# Patient Record
Sex: Female | Born: 1960 | Race: White | Hispanic: No | Smoking: Never smoker
Health system: Southern US, Community
[De-identification: ages and names within clinical notes are randomized; demographics above are authoritative.]

## PROBLEM LIST (undated history)

## (undated) DIAGNOSIS — F419 Anxiety disorder, unspecified: Secondary | ICD-10-CM

## (undated) DIAGNOSIS — Z923 Personal history of irradiation: Secondary | ICD-10-CM

## (undated) DIAGNOSIS — E785 Hyperlipidemia, unspecified: Secondary | ICD-10-CM

## (undated) DIAGNOSIS — C50919 Malignant neoplasm of unspecified site of unspecified female breast: Secondary | ICD-10-CM

## (undated) DIAGNOSIS — C50412 Malignant neoplasm of upper-outer quadrant of left female breast: Secondary | ICD-10-CM

## (undated) DIAGNOSIS — K219 Gastro-esophageal reflux disease without esophagitis: Secondary | ICD-10-CM

## (undated) DIAGNOSIS — I10 Essential (primary) hypertension: Secondary | ICD-10-CM

## (undated) DIAGNOSIS — E119 Type 2 diabetes mellitus without complications: Secondary | ICD-10-CM

## (undated) HISTORY — DX: Hyperlipidemia, unspecified: E78.5

## (undated) HISTORY — DX: Gastro-esophageal reflux disease without esophagitis: K21.9

## (undated) HISTORY — PX: MOUTH SURGERY: SHX715

## (undated) HISTORY — DX: Malignant neoplasm of unspecified site of unspecified female breast: C50.919

## (undated) HISTORY — DX: Essential (primary) hypertension: I10

## (undated) HISTORY — DX: Type 2 diabetes mellitus without complications: E11.9

## (undated) HISTORY — DX: Malignant neoplasm of upper-outer quadrant of left female breast: C50.412

---

## 2008-07-05 ENCOUNTER — Ambulatory Visit: Payer: Self-pay | Admitting: Family Medicine

## 2009-10-03 ENCOUNTER — Ambulatory Visit: Payer: Self-pay | Admitting: Family Medicine

## 2009-10-30 ENCOUNTER — Emergency Department: Payer: Self-pay | Admitting: Emergency Medicine

## 2010-12-12 ENCOUNTER — Ambulatory Visit: Payer: Self-pay | Admitting: Unknown Physician Specialty

## 2012-07-09 ENCOUNTER — Ambulatory Visit: Payer: Self-pay | Admitting: Physician Assistant

## 2014-08-15 ENCOUNTER — Ambulatory Visit: Payer: Self-pay | Admitting: Physician Assistant

## 2014-11-02 DIAGNOSIS — E669 Obesity, unspecified: Secondary | ICD-10-CM | POA: Insufficient documentation

## 2016-02-20 ENCOUNTER — Other Ambulatory Visit: Payer: Self-pay | Admitting: Physician Assistant

## 2016-02-20 DIAGNOSIS — Z1231 Encounter for screening mammogram for malignant neoplasm of breast: Secondary | ICD-10-CM

## 2016-03-07 ENCOUNTER — Ambulatory Visit: Payer: Self-pay | Attending: Physician Assistant

## 2016-03-28 ENCOUNTER — Ambulatory Visit
Admission: RE | Admit: 2016-03-28 | Discharge: 2016-03-28 | Disposition: A | Discharge status: Home / Self Care | Payer: 59 | Source: Ambulatory Visit | Attending: Physician Assistant | Admitting: Physician Assistant

## 2016-03-28 DIAGNOSIS — Z1231 Encounter for screening mammogram for malignant neoplasm of breast: Secondary | ICD-10-CM | POA: Insufficient documentation

## 2016-11-12 DIAGNOSIS — I10 Essential (primary) hypertension: Secondary | ICD-10-CM | POA: Diagnosis not present

## 2016-11-12 DIAGNOSIS — E782 Mixed hyperlipidemia: Secondary | ICD-10-CM | POA: Diagnosis not present

## 2017-02-10 DIAGNOSIS — I10 Essential (primary) hypertension: Secondary | ICD-10-CM | POA: Diagnosis not present

## 2017-02-10 DIAGNOSIS — R739 Hyperglycemia, unspecified: Secondary | ICD-10-CM | POA: Diagnosis not present

## 2017-02-10 DIAGNOSIS — E782 Mixed hyperlipidemia: Secondary | ICD-10-CM | POA: Diagnosis not present

## 2017-02-12 DIAGNOSIS — E782 Mixed hyperlipidemia: Secondary | ICD-10-CM | POA: Diagnosis not present

## 2017-02-12 DIAGNOSIS — I10 Essential (primary) hypertension: Secondary | ICD-10-CM | POA: Diagnosis not present

## 2017-02-17 DIAGNOSIS — R7303 Prediabetes: Secondary | ICD-10-CM | POA: Insufficient documentation

## 2017-05-07 ENCOUNTER — Other Ambulatory Visit: Payer: Self-pay | Admitting: Physician Assistant

## 2017-05-07 DIAGNOSIS — E782 Mixed hyperlipidemia: Secondary | ICD-10-CM | POA: Diagnosis not present

## 2017-05-07 DIAGNOSIS — Z0000 Encounter for general adult medical examination without abnormal findings: Secondary | ICD-10-CM | POA: Diagnosis not present

## 2017-05-07 DIAGNOSIS — I10 Essential (primary) hypertension: Secondary | ICD-10-CM | POA: Diagnosis not present

## 2017-05-07 DIAGNOSIS — Z1231 Encounter for screening mammogram for malignant neoplasm of breast: Secondary | ICD-10-CM

## 2017-05-07 DIAGNOSIS — Z124 Encounter for screening for malignant neoplasm of cervix: Secondary | ICD-10-CM | POA: Diagnosis not present

## 2017-05-22 ENCOUNTER — Ambulatory Visit
Admission: RE | Admit: 2017-05-22 | Discharge: 2017-05-22 | Disposition: A | Discharge status: Home / Self Care | Payer: 59 | Source: Ambulatory Visit | Attending: Physician Assistant | Admitting: Physician Assistant

## 2017-05-22 DIAGNOSIS — Z1231 Encounter for screening mammogram for malignant neoplasm of breast: Secondary | ICD-10-CM

## 2017-08-11 DIAGNOSIS — Z23 Encounter for immunization: Secondary | ICD-10-CM | POA: Diagnosis not present

## 2017-09-14 DIAGNOSIS — J209 Acute bronchitis, unspecified: Secondary | ICD-10-CM | POA: Diagnosis not present

## 2017-11-04 DIAGNOSIS — I10 Essential (primary) hypertension: Secondary | ICD-10-CM | POA: Diagnosis not present

## 2017-11-04 DIAGNOSIS — E782 Mixed hyperlipidemia: Secondary | ICD-10-CM | POA: Diagnosis not present

## 2017-11-04 DIAGNOSIS — R7303 Prediabetes: Secondary | ICD-10-CM | POA: Diagnosis not present

## 2017-11-10 DIAGNOSIS — E782 Mixed hyperlipidemia: Secondary | ICD-10-CM | POA: Diagnosis not present

## 2017-11-10 DIAGNOSIS — R7303 Prediabetes: Secondary | ICD-10-CM | POA: Diagnosis not present

## 2017-11-10 DIAGNOSIS — I10 Essential (primary) hypertension: Secondary | ICD-10-CM | POA: Diagnosis not present

## 2017-11-29 DIAGNOSIS — C50412 Malignant neoplasm of upper-outer quadrant of left female breast: Secondary | ICD-10-CM

## 2017-11-29 HISTORY — DX: Malignant neoplasm of upper-outer quadrant of left female breast: C50.412

## 2017-12-29 DIAGNOSIS — R7303 Prediabetes: Secondary | ICD-10-CM | POA: Diagnosis not present

## 2017-12-29 DIAGNOSIS — I10 Essential (primary) hypertension: Secondary | ICD-10-CM | POA: Diagnosis not present

## 2018-02-24 DIAGNOSIS — E782 Mixed hyperlipidemia: Secondary | ICD-10-CM | POA: Diagnosis not present

## 2018-02-24 DIAGNOSIS — R7303 Prediabetes: Secondary | ICD-10-CM | POA: Diagnosis not present

## 2018-02-24 DIAGNOSIS — I10 Essential (primary) hypertension: Secondary | ICD-10-CM | POA: Diagnosis not present

## 2018-03-03 DIAGNOSIS — E782 Mixed hyperlipidemia: Secondary | ICD-10-CM | POA: Diagnosis not present

## 2018-03-03 DIAGNOSIS — R7303 Prediabetes: Secondary | ICD-10-CM | POA: Diagnosis not present

## 2018-03-03 DIAGNOSIS — I10 Essential (primary) hypertension: Secondary | ICD-10-CM | POA: Diagnosis not present

## 2018-06-03 DIAGNOSIS — I10 Essential (primary) hypertension: Secondary | ICD-10-CM | POA: Diagnosis not present

## 2018-06-03 DIAGNOSIS — E782 Mixed hyperlipidemia: Secondary | ICD-10-CM | POA: Diagnosis not present

## 2018-06-03 DIAGNOSIS — R7303 Prediabetes: Secondary | ICD-10-CM | POA: Diagnosis not present

## 2018-06-08 DIAGNOSIS — Z1239 Encounter for other screening for malignant neoplasm of breast: Secondary | ICD-10-CM | POA: Diagnosis not present

## 2018-06-08 DIAGNOSIS — I10 Essential (primary) hypertension: Secondary | ICD-10-CM | POA: Diagnosis not present

## 2018-06-08 DIAGNOSIS — Z0000 Encounter for general adult medical examination without abnormal findings: Secondary | ICD-10-CM | POA: Diagnosis not present

## 2018-07-23 ENCOUNTER — Other Ambulatory Visit: Payer: Self-pay | Admitting: Physician Assistant

## 2018-07-23 DIAGNOSIS — Z1231 Encounter for screening mammogram for malignant neoplasm of breast: Secondary | ICD-10-CM

## 2018-07-28 DIAGNOSIS — C50919 Malignant neoplasm of unspecified site of unspecified female breast: Secondary | ICD-10-CM

## 2018-07-28 HISTORY — DX: Malignant neoplasm of unspecified site of unspecified female breast: C50.919

## 2018-07-29 ENCOUNTER — Ambulatory Visit
Admission: RE | Admit: 2018-07-29 | Discharge: 2018-07-29 | Disposition: A | Discharge status: Home / Self Care | Payer: 59 | Source: Ambulatory Visit | Attending: Physician Assistant | Admitting: Physician Assistant

## 2018-07-29 DIAGNOSIS — Z1231 Encounter for screening mammogram for malignant neoplasm of breast: Secondary | ICD-10-CM | POA: Insufficient documentation

## 2018-08-03 ENCOUNTER — Other Ambulatory Visit: Payer: Self-pay | Admitting: Physician Assistant

## 2018-08-03 DIAGNOSIS — J40 Bronchitis, not specified as acute or chronic: Secondary | ICD-10-CM | POA: Diagnosis not present

## 2018-08-03 DIAGNOSIS — R928 Other abnormal and inconclusive findings on diagnostic imaging of breast: Secondary | ICD-10-CM

## 2018-08-07 ENCOUNTER — Ambulatory Visit
Admission: RE | Admit: 2018-08-07 | Discharge: 2018-08-07 | Disposition: A | Discharge status: Home / Self Care | Payer: 59 | Source: Ambulatory Visit | Attending: Physician Assistant | Admitting: Physician Assistant

## 2018-08-07 DIAGNOSIS — R928 Other abnormal and inconclusive findings on diagnostic imaging of breast: Secondary | ICD-10-CM | POA: Diagnosis present

## 2018-08-07 DIAGNOSIS — N6489 Other specified disorders of breast: Secondary | ICD-10-CM | POA: Diagnosis not present

## 2018-08-11 ENCOUNTER — Other Ambulatory Visit: Payer: Self-pay | Admitting: Physician Assistant

## 2018-08-11 DIAGNOSIS — R928 Other abnormal and inconclusive findings on diagnostic imaging of breast: Secondary | ICD-10-CM

## 2018-08-17 ENCOUNTER — Ambulatory Visit
Admission: RE | Admit: 2018-08-17 | Discharge: 2018-08-17 | Disposition: A | Discharge status: Home / Self Care | Payer: 59 | Source: Ambulatory Visit | Attending: Physician Assistant | Admitting: Physician Assistant

## 2018-08-17 DIAGNOSIS — R928 Other abnormal and inconclusive findings on diagnostic imaging of breast: Secondary | ICD-10-CM

## 2018-08-17 DIAGNOSIS — C50412 Malignant neoplasm of upper-outer quadrant of left female breast: Secondary | ICD-10-CM | POA: Diagnosis not present

## 2018-08-17 HISTORY — PX: BREAST BIOPSY: SHX20

## 2018-08-19 DIAGNOSIS — C50412 Malignant neoplasm of upper-outer quadrant of left female breast: Secondary | ICD-10-CM | POA: Diagnosis not present

## 2018-08-20 ENCOUNTER — Other Ambulatory Visit: Payer: Self-pay | Admitting: Physician Assistant

## 2018-08-20 ENCOUNTER — Other Ambulatory Visit: Payer: Self-pay | Admitting: Anatomic Pathology & Clinical Pathology

## 2018-08-20 ENCOUNTER — Other Ambulatory Visit: Payer: Self-pay

## 2018-08-20 ENCOUNTER — Other Ambulatory Visit (HOSPITAL_COMMUNITY): Payer: Self-pay | Admitting: Physician Assistant

## 2018-08-20 DIAGNOSIS — C50412 Malignant neoplasm of upper-outer quadrant of left female breast: Secondary | ICD-10-CM

## 2018-08-21 LAB — SURGICAL PATHOLOGY

## 2018-08-25 NOTE — Progress Notes (Signed)
  Oncology Nurse Navigator Documentation  Navigator Location: [facility] (08/24/18 1600)   )Navigator Encounter Type: Other([facility] patient visiting family member) (08/24/18 1600)   Abnormal Finding Date: 08/07/18 (08/24/18 1600) Confirmed Diagnosis Date: 08/17/18 (08/24/18 1600)               Patient Visit Type: Initial (08/24/18 1600) Treatment Phase: Pre-Tx/Tx Discussion (08/24/18 1600) Barriers/Navigation Needs: Education;Coordination of Care (08/24/18 1600) Education: Newly Diagnosed Cancer Education;Coping with Diagnosis/ Prognosis;Understanding Cancer/ Treatment Options (08/24/18 1600) Interventions: Education;Psycho-social support (08/24/18 1600)                      Time Spent with Patient: 45 (08/24/18 1600)     Met with patient , and her niece here at hospital.  Introduced to [employer].  Given Breast Cancer Treatment Handbook/folder with hospital services. Confirmed appointment with Dr. Bary Castilla.

## 2018-08-26 ENCOUNTER — Encounter (HOSPITAL_COMMUNITY): Payer: Self-pay

## 2018-08-26 ENCOUNTER — Other Ambulatory Visit: Payer: Self-pay | Admitting: Physician Assistant

## 2018-08-26 ENCOUNTER — Ambulatory Visit (HOSPITAL_COMMUNITY)
Admission: RE | Admit: 2018-08-26 | Discharge: 2018-08-26 | Disposition: A | Discharge status: Home / Self Care | Payer: 59 | Source: Ambulatory Visit | Attending: Physician Assistant | Admitting: Physician Assistant

## 2018-08-26 DIAGNOSIS — C50412 Malignant neoplasm of upper-outer quadrant of left female breast: Secondary | ICD-10-CM

## 2018-08-26 MED ORDER — GADOBUTROL 1 MMOL/ML IV SOLN: 10.0000 mL | Freq: Once | INTRAVENOUS | Status: DC | PRN

## 2018-08-26 NOTE — Progress Notes (Signed)
Attempted patient for MRI Breast wo/w, due to her body habitus and breast size, Dr Melanee Spry, Radiologist recommends she have her exam done at [facility] imaging. Spoke with Caryl Pina at Dr Liston Alba office to schedule and she will call patient with new appointment day/time.

## 2018-08-27 ENCOUNTER — Other Ambulatory Visit: Payer: Self-pay

## 2018-08-27 ENCOUNTER — Other Ambulatory Visit: Payer: Self-pay | Admitting: Physician Assistant

## 2018-08-27 ENCOUNTER — Encounter: Payer: Self-pay | Admitting: General Surgery

## 2018-08-27 ENCOUNTER — Ambulatory Visit: Payer: 59 | Admitting: General Surgery

## 2018-08-27 VITALS — BP 131/76 | HR 90 | Temp 91.5°F | Ht 60.0 in | Wt 223.6 lb

## 2018-08-27 DIAGNOSIS — C50412 Malignant neoplasm of upper-outer quadrant of left female breast: Secondary | ICD-10-CM

## 2018-08-27 DIAGNOSIS — E782 Mixed hyperlipidemia: Secondary | ICD-10-CM | POA: Insufficient documentation

## 2018-08-27 DIAGNOSIS — C50912 Malignant neoplasm of unspecified site of left female breast: Secondary | ICD-10-CM

## 2018-08-27 DIAGNOSIS — F419 Anxiety disorder, unspecified: Secondary | ICD-10-CM | POA: Insufficient documentation

## 2018-08-27 DIAGNOSIS — I10 Essential (primary) hypertension: Secondary | ICD-10-CM | POA: Insufficient documentation

## 2018-08-27 DIAGNOSIS — Z170 Estrogen receptor positive status [ER+]: Principal | ICD-10-CM

## 2018-08-27 NOTE — Progress Notes (Signed)
Patient ID: Angela Glass, female   DOB: 1961-02-28, 57 y.o.   MRN: [id number]  Chief Complaint  Patient presents with  . New Patient (Initial Visit)    HPI Angela Glass is a 57 y.o. female for an evaluation for an abnormal mammogram done on 07/29/2018 ordered by Dr. Paulita Cradle on her physical exam on September. She then was told that she needed a breast biopsy done on her left breast and found invasive lobular carcinoma. Patient stated that there is no history of breast cancer. Patient is scheduled to have a MRI of bilateral breast on 09/01/2018 at [place]. Patient denied pain, nipple discharge, discoloration of the left breast. Patient does her monthly breast exam. Patient is accompanied by her niece Angela Glass, who is a pediatric ICU nurse at [employer].  HPI  Past Medical History:  Diagnosis Date  . Diabetes mellitus without complication ([place])   . GERD (gastroesophageal reflux disease)   . Hyperlipidemia   . Hypertension     Past Surgical History:  Procedure Laterality Date  . BREAST BIOPSY Left 08/17/2018   Affirm bx(ribbon clip) path pending    Family History  Problem Relation Age of Onset  . Throat cancer Mother 3  . COPD Mother   . Healthy Father   . Healthy Brother   . Lung cancer Maternal Uncle   . Lung cancer Paternal Grandfather   . Heart attack Brother 36  . Lung cancer Maternal Uncle   . Breast cancer Neg Hx     Social History Social History   Tobacco Use  . Smoking status: Never Smoker  . Smokeless tobacco: Never Used  Substance Use Topics  . Alcohol use: Yes    Comment: ocassional  . Drug use: Never    Allergies  Allergen Reactions  . Lisinopril-Hydrochlorothiazide Cough    Current Outpatient Medications  Medication Sig Dispense Refill  . ALPRAZolam (XANAX) 0.5 MG tablet Take by mouth.    . losartan-hydrochlorothiazide (HYZAAR) 100-12.5 MG tablet Take by mouth.    . metFORMIN (GLUCOPHAGE) 500 MG tablet  Take by mouth.    Marland Kitchen omeprazole (PRILOSEC) 40 MG capsule TK 1 C PO ONCE D  4  . rosuvastatin (CRESTOR) 10 MG tablet Take by mouth.    . venlafaxine XR (EFFEXOR-XR) 37.5 MG 24 hr capsule TK 3 CS PO ONCE D  3   No current facility-administered medications for this visit.     Review of Systems Review of Systems  Constitutional: Negative.   Respiratory: Negative.   Cardiovascular: Negative.   Skin: Negative.   Neurological: Negative.   Psychiatric/Behavioral: Negative.     Blood pressure 131/76, pulse 90, temperature (!) 91.5 F (33.1 C), temperature source Skin, height 5' (1.524 m), weight 223 lb 9.6 oz (101.4 kg), SpO2 97 %.  Physical Exam Physical Exam  Constitutional: She is oriented to person, place, and time. She appears well-developed and well-nourished.  Eyes: Conjunctivae are normal. No scleral icterus.  Neck: Normal range of motion.  Cardiovascular: Normal rate, regular rhythm and normal heart sounds.  Pulmonary/Chest: Effort normal and breath sounds normal. Right breast exhibits no inverted nipple, no mass, no nipple discharge, no skin change and no tenderness. Left breast exhibits no inverted nipple, no mass, no nipple discharge, no skin change and no tenderness.  Lymphadenopathy:    She has no cervical adenopathy.  Neurological: She is alert and oriented to person, place, and time.  Skin: Skin is warm and dry.  Psychiatric: She has a  normal mood and affect. Her behavior is normal.    Data Reviewed July 2018 through October 2019 mammograms and ultrasound reviewed.  Tiny architectural change in the upper outer quadrant of the left breast.  Negative associated ultrasound. A. BREAST, LEFT, UPPER OUTER QUADRANT; STEREOTACTIC-GUIDED CORE BIOPSY:  - INVASIVE LOBULAR CARCINOMA.   Size of invasive carcinoma: 6 mm in this sample  Histologic grade of invasive carcinoma: Grade 1            Glandular/tubular differentiation score: 3            Nuclear  pleomorphism score: 1            Mitotic rate score: 1            Total score: 5  Ductal carcinoma in situ: Not identified  Lobular carcinoma in situ:Present  Lymphovascular invasion: Not identified   BREAST BIOMARKER TESTS  Estrogen Receptor (ER) Status: POSITIVE  Percentage of cells with nuclear positivity: >90%  Average intensity of staining: Moderate   Progesterone Receptor (PR) Status: POSITIVE  Percentage of cells with nuclear positivity: >90%  Average intensity of staining: Moderate   HER2 (by immunohistochemistry): NEGATIVE (score 0)   Assessment    Clinical stage I invasive lobular carcinoma of the left breast.    Plan    The majority of the 1 hour visit was spent reviewing the options for breast cancer treatment. Breast conservation with lumpectomy and radiation therapy  was presented as equivalent to mastectomy for long-term control. The pros and cons of each treatment regimen were reviewed. The indications for additional therapy such as chemotherapy were touched on briefly, realizing that the majority of information required to determine if chemotherapy would be of benefit is not available at this time.  It is highly unlikely that she would be a candidate for adjuvant chemotherapy unless significantly more disease is identified at the time of surgery.  The availability of consultation services for medical oncology and radiation oncology prior to surgery were reviewed.  Opportunity for second surgical opinion reviewed.  The patient was advised that MRIs frequently see areas that are not of clinical concern, and that should not be alarmed should additional biopsies be recommended.  He is also advised that the study tends to overestimate the size of disease, and with a 5  F breast there should be no difficulty in breast conservation at this is her desire.  The patient will call the office on September 01, 2018 to confirm that her MRI has been completed so  it can be reviewed.  Informational brochure was provided.  She was encouraged to call should she develop questions.  The patient is aware that a preoperative visit will be completed to determine if the area of concern can be identified on ultrasound post biopsy (not so prebiopsy).  This would allow her to avoid wire localization.     HPI, Physical Exam, Assessment and Plan have been scribed under the direction and in the presence of Robert Bellow, MD. Wayna Chalet, CMA  I have completed the exam and reviewed the above documentation for accuracy and completeness.  I agree with the above.  Haematologist has been used and any errors in dictation or transcription are unintentional.  Hervey Ard, M.D., F.A.C.S.  Forest Gleason Sofya Moustafa 08/27/2018, 10:14 PM

## 2018-09-01 ENCOUNTER — Ambulatory Visit
Admission: RE | Admit: 2018-09-01 | Discharge: 2018-09-01 | Disposition: A | Discharge status: Home / Self Care | Payer: 59 | Source: Ambulatory Visit | Attending: Physician Assistant | Admitting: Physician Assistant

## 2018-09-01 ENCOUNTER — Telehealth: Payer: Self-pay | Admitting: *Deleted

## 2018-09-01 DIAGNOSIS — Z170 Estrogen receptor positive status [ER+]: Principal | ICD-10-CM

## 2018-09-01 DIAGNOSIS — D0502 Lobular carcinoma in situ of left breast: Secondary | ICD-10-CM | POA: Diagnosis not present

## 2018-09-01 DIAGNOSIS — C50412 Malignant neoplasm of upper-outer quadrant of left female breast: Secondary | ICD-10-CM

## 2018-09-01 MED ORDER — GADOBUTROL 1 MMOL/ML IV SOLN
10.0000 mL | Freq: Once | INTRAVENOUS | Status: AC | PRN
  Administered 2018-09-01: 10 mL via INTRAVENOUS

## 2018-09-01 NOTE — Telephone Encounter (Signed)
Patient called and wanted to let you know that she did have her MRI done today

## 2018-09-02 NOTE — Telephone Encounter (Signed)
Single focus of invasive lobular carcinoma.

## 2018-09-03 NOTE — Progress Notes (Signed)
Opened in error

## 2018-09-03 NOTE — Progress Notes (Signed)
  Oncology Nurse Navigator Documentation  Navigator Location: [facility]c (09/03/18 1500)   )Navigator Encounter Type: Introductory phone call (09/03/18 1500)                     Patient Visit Type: MedOnc;Initial (09/03/18 1500)   Barriers/Navigation Needs: Coordination of Care (09/03/18 1500) Education: Accessing Care/ Finding Providers (09/03/18 1500) Interventions: Coordination of Care (09/03/18 1500)   Coordination of Care: Appts (09/03/18 1500)                  Time Spent with Patient: 45 (09/03/18 1500)   Referral for Med/Onc consult received from Primary provider. Scheduled with Dr. Janese Banks 09/10/18 at 10:00.  Notified patient, and Dr. Bary Castilla.  Patient reports she received MRI results today. Navigator will accompany to initial Med/Onc appointment.

## 2018-09-03 NOTE — Telephone Encounter (Signed)
Notified patient as instructed, patient agrees.  Patient was very pleased.

## 2018-09-04 ENCOUNTER — Other Ambulatory Visit: Payer: 59

## 2018-09-04 ENCOUNTER — Telehealth: Payer: Self-pay | Admitting: General Surgery

## 2018-09-04 NOTE — Telephone Encounter (Signed)
Patients calling said she was seen last week, and had an mri done,and received the results. Patient is now asking what her next step will be, to schedule her surgery.please call patient and advise.

## 2018-09-04 NOTE — Telephone Encounter (Signed)
Patient scheduled 09/08/18 @ 10:15 am with Dr.Byrnett

## 2018-09-08 ENCOUNTER — Other Ambulatory Visit: Payer: Self-pay

## 2018-09-08 ENCOUNTER — Encounter: Payer: Self-pay | Admitting: General Surgery

## 2018-09-08 ENCOUNTER — Ambulatory Visit: Payer: 59 | Admitting: General Surgery

## 2018-09-08 ENCOUNTER — Ambulatory Visit: Payer: Self-pay

## 2018-09-08 VITALS — BP 140/70 | HR 100 | Temp 97.5°F | Resp 13 | Ht 60.0 in | Wt 226.0 lb

## 2018-09-08 DIAGNOSIS — C50912 Malignant neoplasm of unspecified site of left female breast: Secondary | ICD-10-CM

## 2018-09-08 MED ORDER — LIDOCAINE-PRILOCAINE 2.5-2.5 % EX CREA: TOPICAL_CREAM | CUTANEOUS | 0 refills | Status: DC

## 2018-09-08 NOTE — Patient Instructions (Addendum)
Schedule surgery for left breast cancer. The patient is scheduled for surgery at [facility] with Dr Bary Castilla on 09/28/18. She will pre admit by phone. We will call the patient with her arrival time and location for surgery. The patient is aware of date and instructions.

## 2018-09-08 NOTE — Progress Notes (Signed)
Patient ID: Angela Glass, female   DOB: 1961-07-24, 57 y.o.   MRN: [id number]  Chief Complaint  Patient presents with  . Pre-op Exam    HPI Angela Glass is a 57 y.o. female.  Here for follow up and surgery discussion for left breast cancer. MRI breast was 09-01-18.  HPI  Past Medical History:  Diagnosis Date  . Diabetes mellitus without complication ([place])   . GERD (gastroesophageal reflux disease)   . Hyperlipidemia   . Hypertension     Past Surgical History:  Procedure Laterality Date  . BREAST BIOPSY Left 08/17/2018   Affirm bx(ribbon clip) path pending    Family History  Problem Relation Age of Onset  . Throat cancer Mother 51  . COPD Mother   . Healthy Father   . Healthy Brother   . Lung cancer Maternal Uncle   . Lung cancer Paternal Grandfather   . Heart attack Brother 70  . Lung cancer Maternal Uncle   . Breast cancer Neg Hx     Social History Social History   Tobacco Use  . Smoking status: Never Smoker  . Smokeless tobacco: Never Used  Substance Use Topics  . Alcohol use: Yes    Comment: ocassional  . Drug use: Never    Allergies  Allergen Reactions  . Lisinopril-Hydrochlorothiazide Cough    Current Outpatient Medications  Medication Sig Dispense Refill  . ALPRAZolam (XANAX) 0.5 MG tablet Take by mouth.    . losartan-hydrochlorothiazide (HYZAAR) 100-12.5 MG tablet Take by mouth.    . metFORMIN (GLUCOPHAGE) 500 MG tablet Take by mouth.    Marland Kitchen omeprazole (PRILOSEC) 40 MG capsule TK 1 C PO ONCE D  4  . rosuvastatin (CRESTOR) 10 MG tablet Take by mouth.    . venlafaxine XR (EFFEXOR-XR) 37.5 MG 24 hr capsule TK 3 CS PO ONCE D  3  . lidocaine-prilocaine (EMLA) cream Apply to the areola and cover with plastic wrap one hour prior to leaving for surgery. 5 g 0   No current facility-administered medications for this visit.     Review of Systems Review of Systems  Constitutional: Negative.   Respiratory: Negative.   Cardiovascular:  Negative.     Blood pressure 140/70, pulse 100, temperature (!) 97.5 F (36.4 C), temperature source Skin, resp. rate 13, height 5' (1.524 m), weight 226 lb (102.5 kg), SpO2 100 %.  Physical Exam Physical Exam  Constitutional: She is oriented to person, place, and time. She appears well-developed and well-nourished.  HENT:  Mouth/Throat: Oropharynx is clear and moist.  Eyes: Conjunctivae are normal. No scleral icterus.  Neck: Neck supple.  Cardiovascular: Normal rate, regular rhythm and normal heart sounds.  Pulmonary/Chest: Effort normal and breath sounds normal. Right breast exhibits no inverted nipple, no mass, no nipple discharge, no skin change and no tenderness. Left breast exhibits no inverted nipple, no mass, no nipple discharge, no skin change and no tenderness.  Lymphadenopathy:    She has no cervical adenopathy.    She has no axillary adenopathy.  Neurological: She is alert and oriented to person, place, and time.  Skin: Skin is warm and dry.  Psychiatric: Her behavior is normal.    Data Reviewed September 01, 2018 bilateral breast MRI:  IMPRESSION: Known malignancy in the UPPER-OUTER QUADRANT of the LEFT breast is 2.8 centimeters maximum diameter.  No suspicious findings in the RIGHT breast.  Ultrasound examination of the left breast was completed to determine if preoperative wire localization would be noted.  Of note, the area had not shown any abnormality on prebiopsy ultrasound.  Ultrasound examination of the left breast at the 12 o'clock position, 7 cm from the nipple showed an ill-defined irregular area measuring 1.72 x 1.94 x 1.95.  This may well correspond to the bite site although a clip is not well identified.  Correlation with post biopsy mammogram appropriate versus proceeding with localization.  BI-RADS-6.  Assessment    Stage II carcinoma of the left breast based on MRI size, possibly upstaged based on post biopsy timing.  Candidate for breast  conservation.    Plan    Schedule surgery, left breast wide excision and SLN biopsy.  Potential for reexcision if margins edges were not clear reviewed.  Indication for sentinel node biopsy discussed.  Use of EMLA cream prior to sentinel node injection reviewed.   The patient is aware to call back for any questions or new concerns.  The majority of today's visit was spent reviewing options for surgical management.  Based on her very generous breast volume and excellent ratio of tumor to normal breast, I think she is a good candidate for wide excision.  Ideally, adequate spacing will be made for accelerated partial breast radiation.     HPI, Physical Exam, Assessment and Plan have been scribed under the direction and in the presence of Robert Bellow, MD. Karie Fetch, RN  I have completed the exam and reviewed the above documentation for accuracy and completeness.  I agree with the above.  Haematologist has been used and any errors in dictation or transcription are unintentional.  Hervey Ard, M.D., F.A.C.S.   Angela Glass 09/08/2018, 8:44 PM

## 2018-09-09 ENCOUNTER — Other Ambulatory Visit: Payer: Self-pay | Admitting: General Surgery

## 2018-09-09 DIAGNOSIS — C50912 Malignant neoplasm of unspecified site of left female breast: Secondary | ICD-10-CM

## 2018-09-10 ENCOUNTER — Encounter: Payer: Self-pay | Admitting: Oncology

## 2018-09-10 ENCOUNTER — Other Ambulatory Visit: Payer: Self-pay | Admitting: General Surgery

## 2018-09-10 ENCOUNTER — Inpatient Hospital Stay: Payer: Commercial Managed Care - HMO | Attending: Oncology | Admitting: Oncology

## 2018-09-10 VITALS — BP 127/85 | HR 85 | Temp 97.9°F | Resp 18 | Ht 60.0 in | Wt 224.8 lb

## 2018-09-10 DIAGNOSIS — Z7189 Other specified counseling: Secondary | ICD-10-CM

## 2018-09-10 DIAGNOSIS — Z170 Estrogen receptor positive status [ER+]: Secondary | ICD-10-CM | POA: Insufficient documentation

## 2018-09-10 DIAGNOSIS — C50412 Malignant neoplasm of upper-outer quadrant of left female breast: Secondary | ICD-10-CM | POA: Diagnosis present

## 2018-09-10 DIAGNOSIS — C50912 Malignant neoplasm of unspecified site of left female breast: Secondary | ICD-10-CM

## 2018-09-10 NOTE — Progress Notes (Signed)
  Oncology Nurse Navigator Documentation  Navigator Location: [facility]c (09/10/18 1600) Referral date to RadOnc/MedOnc: 09/10/18 (09/10/18 1600) )Navigator Encounter Type: Initial MedOnc (09/10/18 1600)                     Patient Visit Type: MedOnc;Initial (09/10/18 1600)   Barriers/Navigation Needs: Education;Coordination of Care (09/10/18 1600)                          Time Spent with Patient: 45 (09/10/18 1600)   Met with patient at initial Med/Onc visit with Dr. Janese Banks.  Plans for surgery on 09/28/18.  Will follow-up post op with Dr. Janese Banks and Dr. Baruch Gouty on 10/06/18.

## 2018-09-10 NOTE — Progress Notes (Signed)
Hematology/Oncology Consult note [facility] [phone] [phone]  Patient Care Team: Marinda Elk, MD as PCP - General (Physician Assistant)   Name of the patient: Angela Glass  [id number]  [birth year]    Reason for referral-new diagnosis of breast cancer   Referring physician-Dr. Carrie Mew  Date of visit: 09/10/18   History of presenting illness-patient is a 57 year old female who has been getting routine screening mammograms and her most recent mammogram in October 2019 showed a possible distortion in the left breast which was biopsied and showed invasive lobular carcinoma grade 1 strongly ER PR positive HER-2/neu negative 6 mm.  Patient also had an MRI which showed non-mass enhancement of 2.8 x 1.5 x 0.8 cm in the upper outer quadrant of the left breast.  No abnormal appearing lymph nodes.  Patient has been seen by Dr. Bary Castilla and plan is to proceed with lumpectomy on 09/28/2018.  Patient lives alone and her past medical history is significant for prediabetes obesity and hypertension.  She has no children.  Family history significant for ovarian cancer in her maternal grandmother.  No other family history of breast colon pancreatic or stomach cancer.  She currently works full-time and she is doing well today and denies any specific complaints today   ECOG PS- 1  Pain scale- 0   Review of systems- Review of Systems  Constitutional: Negative for chills, fever, malaise/fatigue and weight loss.  HENT: Negative for congestion, ear discharge and nosebleeds.   Eyes: Negative for blurred vision.  Respiratory: Negative for cough, hemoptysis, sputum production, shortness of breath and wheezing.   Cardiovascular: Negative for chest pain, palpitations, orthopnea and claudication.  Gastrointestinal: Negative for abdominal pain, blood in stool, constipation, diarrhea, heartburn, melena, nausea and vomiting.  Genitourinary: Negative for  dysuria, flank pain, frequency, hematuria and urgency.  Musculoskeletal: Negative for back pain, joint pain and myalgias.  Skin: Negative for rash.  Neurological: Negative for dizziness, tingling, focal weakness, seizures, weakness and headaches.  Endo/Heme/Allergies: Does not bruise/bleed easily.  Psychiatric/Behavioral: Negative for depression and suicidal ideas. The patient does not have insomnia.     Allergies  Allergen Reactions  . Lisinopril-Hydrochlorothiazide Cough    Patient Active Problem List   Diagnosis Date Noted  . Anxiety 08/27/2018  . Benign essential hypertension 08/27/2018  . Hyperlipemia, mixed 08/27/2018  . Invasive lobular carcinoma of breast, stage 1, left ([place]) 08/27/2018  . Prediabetes 02/17/2017  . Obesity (BMI 30-39.9) 11/02/2014     Past Medical History:  Diagnosis Date  . Diabetes mellitus without complication ([place])   . GERD (gastroesophageal reflux disease)   . Hyperlipidemia   . Hypertension      Past Surgical History:  Procedure Laterality Date  . BREAST BIOPSY Left 08/17/2018   Affirm bx(ribbon clip) path pending    Social History   Socioeconomic History  . Marital status: Widowed    Spouse name: Not on file  . Number of children: Not on file  . Years of education: Not on file  . Highest education level: Not on file  Occupational History  . Not on file  Social Needs  . Financial resource strain: Not on file  . Food insecurity:    Worry: Not on file    Inability: Not on file  . Transportation needs:    Medical: Not on file    Non-medical: Not on file  Tobacco Use  . Smoking status: Never Smoker  . Smokeless tobacco: Never Used  Substance and Sexual Activity  .  Alcohol use: Yes    Comment: ocassional  . Drug use: Never  . Sexual activity: Not on file  Lifestyle  . Physical activity:    Days per week: Not on file    Minutes per session: Not on file  . Stress: Not on file  Relationships  . Social connections:    Talks  on phone: Not on file    Gets together: Not on file    Attends religious service: Not on file    Active member of club or organization: Not on file    Attends meetings of clubs or organizations: Not on file    Relationship status: Not on file  . Intimate partner violence:    Fear of current or ex partner: Not on file    Emotionally abused: Not on file    Physically abused: Not on file    Forced sexual activity: Not on file  Other Topics Concern  . Not on file  Social History Narrative  . Not on file     Family History  Problem Relation Age of Onset  . Throat cancer Mother 58  . COPD Mother   . Healthy Father   . Healthy Brother   . Lung cancer Maternal Uncle   . Lung cancer Paternal Grandfather   . Heart attack Brother 5  . Lung cancer Maternal Uncle   . Breast cancer Neg Hx      Current Outpatient Medications:  .  losartan-hydrochlorothiazide (HYZAAR) 100-12.5 MG tablet, Take by mouth., Disp: , Rfl:  .  metFORMIN (GLUCOPHAGE) 500 MG tablet, Take by mouth., Disp: , Rfl:  .  omeprazole (PRILOSEC) 40 MG capsule, TK 1 C PO ONCE D, Disp: , Rfl: 4 .  rosuvastatin (CRESTOR) 10 MG tablet, Take by mouth., Disp: , Rfl:  .  venlafaxine XR (EFFEXOR-XR) 37.5 MG 24 hr capsule, TK 3 CS PO ONCE D, Disp: , Rfl: 3 .  ALPRAZolam (XANAX) 0.5 MG tablet, Take by mouth., Disp: , Rfl:  .  lidocaine-prilocaine (EMLA) cream, Apply to the areola and cover with plastic wrap one hour prior to leaving for surgery. (Patient not taking: Reported on 09/10/2018), Disp: 5 g, Rfl: 0   Physical exam:  Vitals:   09/10/18 1023  BP: 127/85  Pulse: 85  Resp: 18  Temp: 97.9 F (36.6 C)  TempSrc: Tympanic  SpO2: 95%  Weight: 224 lb 12.8 oz (102 kg)  Height: 5' (1.524 m)   Physical Exam  Constitutional: She is oriented to person, place, and time.  Patient is obese.  Does not appear to be in any acute distress  HENT:  Head: Normocephalic and atraumatic.  Eyes: Pupils are equal, round, and reactive to  light. EOM are normal.  Neck: Normal range of motion.  Cardiovascular: Normal rate, regular rhythm and normal heart sounds.  Pulmonary/Chest: Effort normal and breath sounds normal.  Abdominal: Soft. Bowel sounds are normal.  Neurological: She is alert and oriented to person, place, and time.  Skin: Skin is warm and dry.  Breast exam performed in sitting and lying down position.  No palpable breast masses in either breast.  No palpable bilateral axillary adenopathy.    No flowsheet data found. No flowsheet data found.  No images are attached to the encounter.  Mr Breast Bilateral [place] Cad  Result Date: 09/01/2018 CLINICAL DATA:  Recent stereotactic guided core biopsy of distortion in the LEFT breast shows invasive lobular carcinoma. LABS:  Creatinine was obtained on site at  [facility] at [address] Results: Creatinine 0.7 mg/dL.  GFR is 95. EXAM: BILATERAL BREAST MRI WITH AND WITHOUT CONTRAST TECHNIQUE: Multiplanar, multisequence MR images of both breasts were obtained prior to and following the intravenous administration of 10 ml of Gadavist Three-dimensional MR images were rendered by post-processing of the original MR data on an independent workstation. The three-dimensional MR images were interpreted, and findings are reported in the following complete MRI report for this study. Three dimensional images were evaluated at the independent DynaCad workstation COMPARISON:  08/17/2018 and earlier FINDINGS: Breast composition: b. Scattered fibroglandular tissue. Background parenchymal enhancement: Moderate. Right breast: No mass or abnormal enhancement. Left breast: Within the UPPER-OUTER QUADRANT of the LEFT breast and corresponding to a tissue marker clip evident there is non mass enhancement measuring 2.8 x 1.5 x 0.8 centimeters. This demonstrates persistent type enhancement kinetics and correlates well with the known invasive lobular carcinoma in this region. Lymph  nodes: No abnormal appearing lymph nodes. Ancillary findings:  None. IMPRESSION: Known malignancy in the UPPER-OUTER QUADRANT of the LEFT breast is 2.8 centimeters maximum diameter. No suspicious findings in the RIGHT breast. RECOMMENDATION: Treatment plan BI-RADS CATEGORY  6: Known biopsy-proven malignancy. Electronically Signed   By: Nolon Nations M.D.   On: 09/01/2018 17:28   US Breast Complete Uni Left Inc Axilla  Result Date: 09/08/2018 Ultrasound examination of the left breast at the 12 o'clock position, 7 cm from the nipple showed an ill-defined irregular area measuring 1.72 x 1.94 x 1.95.  This may well correspond to the bite site although a clip is not well identified.  Correlation with post biopsy mammogram appropriate versus proceeding with localization.  BI-RADS-6.  Mm Clip Placement Left  Result Date: 08/17/2018 CLINICAL DATA:  Post stereotactic core needle biopsy of the left breast. EXAM: DIAGNOSTIC LEFT MAMMOGRAM POST STEREOTACTIC BIOPSY COMPARISON:  Previous exam(s). FINDINGS: Mammographic images were obtained following stereotactic guided biopsy of the left breast. Two-view mammography demonstrates presence of ribbon shaped marker in expected mammographic position, within the geometric center of the previously identified distortion in the upper outer quadrant. IMPRESSION: Successful placement of ribbon shaped marker post stereotactic core needle biopsy of the left breast. Final Assessment: Post Procedure Mammograms for Marker Placement Electronically Signed   By: Fidela Salisbury M.D.   On: 08/17/2018 11:49   Mm Lt Breast Bx W Loc Dev 1st Lesion Image Bx Spec Stereo Guide  Addendum Date: 08/20/2018   ADDENDUM REPORT: 08/20/2018 16:21 ADDENDUM: Pathology of the left breast biopsy revealed A. BREAST, LEFT, UPPER OUTER QUADRANT; STEREOTACTIC-GUIDED CORE BIOPSY: INVASIVE LOBULAR CARCINOMA. Size of invasive carcinoma: 6 mm in this sample. Histologic grade of invasive carcinoma: Grade  1. Ductal carcinoma in situ: Not identified. Lobular carcinoma in situ: Present. Lymphovascular invasion: Not identified. ER/PR/HER2: Immunohistochemistry will be performed on block A1, with reflex to [place] for HER2 2+. The results will be reported in an addendum. Comment: The definitive grade will be assigned on the excisional specimen. These findings were communicated to Dollene Cleveland in Dr. Kateri Plummer office on 08/18/2018. Read back procedure was performed. This was found to be concordant by Dr. Jetta Lout. Recommendation: Surgical and oncology referrals. Also recommend bilateral breast MRI without and with contrast due to lobular carcinoma. Results and recommendations were relayed to Paulita Cradle, PA's nurse Levada Dy by phone and fax on 08/18/18. The patient will be contacted by Paulita Cradle, PA and she will arrange the referrals. Per notes in the patient's medical record, she has an appointment to see  Dr. Bary Castilla on 08/27/18 at 3:30 PM. Addendum by Jetta Lout, [place] on 08/20/18. Electronically Signed   By: Fidela Salisbury M.D.   On: 08/20/2018 16:21   Result Date: 08/20/2018 CLINICAL DATA:  Left breast upper outer quadrant distortion, best seen on the craniocaudal view. EXAM: LEFT BREAST STEREOTACTIC CORE NEEDLE BIOPSY COMPARISON:  Previous exams. FINDINGS: The patient and I discussed the procedure of stereotactic-guided biopsy including benefits and alternatives. We discussed the high likelihood of a successful procedure. We discussed the risks of the procedure including infection, bleeding, tissue injury, clip migration, and inadequate sampling. Informed written consent was given. The usual time out protocol was performed immediately prior to the procedure. Using sterile technique and 1% Lidocaine as local anesthetic, under stereotactic guidance, a 9 gauge vacuum assisted device was used to perform core needle biopsy of distortion in the lateral left breast using a superior approach. Specimen  radiograph was performed showing presence of tissue. Lesion quadrant: Upper outer quadrant At the conclusion of the procedure, a ribbon shaped tissue marker clip was deployed into the biopsy cavity. Follow-up 2-view mammogram was performed and dictated separately. IMPRESSION: Stereotactic-guided biopsy of left breast. No apparent complications. Electronically Signed: By: Fidela Salisbury M.D. On: 08/17/2018 11:48    Assessment and plan- Patient is a 57 y.o. female with newly diagnosed invasive lobular carcinoma of the left breast stage I CT 2 N0 M0 strongly ER PR positive and HER-2/neu negative grade 1 on biopsy  I discussed the results of the mammogram and MRI with the patient in detail.  Also discussed the results of the pathology.  MRI shows a non-mass enhancement of 2.8 cm and it is unclear if all of this is invasive lobular carcinoma or there is a component of DCIS associated with it.  Given that this is a strongly ER PR positive grade 1 tumor I agree with proceeding with an upfront lumpectomy and sentinel lymph node biopsy at this time.  I will see her back after surgery to discuss the results of pathology with the patient in detail.  If her final pathology shows a grade 1 tumor greater than or equal to 1 cm or tumor that is more than or equal to 5 mm and grade 2 or higher she would be a candidate for Oncotype testing.  I discussed what Oncotype testing is and how the results are interpreted.  If her score comes back is less than 11 she would not benefit from chemotherapy.  Adjuvant chemotherapy would be warranted if her score is 26 or higher.  Given that she is postmenopausal and more than 57 years of age she would also not benefit from chemotherapy if her scores between 11-25.  I will decide about sending Oncotype testing based on her final pathology results.  Given that her tumor is strongly ER PR positive there would be a role for hormone therapy for at least 5 years which I will discuss in  greater detail at my next visit.  Treatment will be given with a curative intent.  There would also be a role for adjuvant radiation therapy after surgery and I will have her see Dr. Donella Stade the same day she sees me  Patient would qualify for genetic testing given personal history of breast cancer and history of ovarian cancer in her maternal grandmother.  However patient has no children and she does not want to know if she is BRCA1 or 2+ at this time.  I will therefore hold off on genetic testing  Cancer Staging Invasive lobular carcinoma of breast, stage 1, left ([place]) Staging form: Breast, AJCC 8th Edition - Clinical: Stage IB (cT2, cN0, cM0, G1, ER+, PR+, HER2-) - Signed by Sindy Guadeloupe, MD on 09/10/2018     Thank you for this kind referral and the opportunity to participate in the care of this patient   Visit Diagnosis 1. Invasive lobular carcinoma of breast, stage 1, left ([place])   2. Goals of care, counseling/discussion     Dr. Randa Evens, MD, MPH [facility] [phone] 09/10/2018

## 2018-09-18 ENCOUNTER — Telehealth: Payer: Self-pay

## 2018-09-18 NOTE — Telephone Encounter (Signed)
The patient will arrive at the [facility] on 09/28/18 at 10:15 am. She is aware of date, time, and instructions.

## 2018-09-22 ENCOUNTER — Other Ambulatory Visit: Payer: Self-pay

## 2018-09-22 ENCOUNTER — Encounter
Admission: RE | Admit: 2018-09-22 | Discharge: 2018-09-22 | Disposition: A | Discharge status: Home / Self Care | Payer: 59 | Source: Ambulatory Visit | Attending: General Surgery | Admitting: General Surgery

## 2018-09-22 HISTORY — DX: Anxiety disorder, unspecified: F41.9

## 2018-09-22 NOTE — Patient Instructions (Addendum)
Your procedure is scheduled on: 09-28-18 MONDAY Report to [facility] @ 10:15 AM    Remember: Instructions that are not followed completely may result in serious medical risk, up to and including death, or upon the discretion of your surgeon and anesthesiologist your surgery may need to be rescheduled.    _x___ 1. Do not eat food after midnight the night before your procedure. NO GUM OR CANDY AFTER MIDNIGHT.  You may drink WATER up to 2 hours before you are scheduled to arrive at the hospital for your procedure.  Do not drink WATER within 2 hours of your scheduled arrival to the hospital.  Type 1 and type 2 diabetics should only drink water.   ____Ensure clear carbohydrate drink on the way to the hospital for bariatric patients  ____Ensure clear carbohydrate drink 3 hours before surgery for Dr Dwyane Luo patients if physician instructed.    __x__ 2. No Alcohol for 24 hours before or after surgery.   __x__3. No Smoking or e-cigarettes for 24 prior to surgery.  Do not use any chewable tobacco products for at least 6 hour prior to surgery   ____  4. Bring all medications with you on the day of surgery if instructed.    __x__ 5. Notify your doctor if there is any change in your medical condition     (cold, fever, infections).    x___6. On the morning of surgery brush your teeth with toothpaste and water.  You may rinse your mouth with mouth wash if you wish.  Do not swallow any toothpaste or mouthwash.   Do not wear jewelry, make-up, hairpins, clips or nail polish.  Do not wear lotions, powders, or perfumes. You may wear deodorant.  Do not shave 48 hours prior to surgery. Men may shave face and neck.  Do not bring valuables to the hospital.    [facility] is not responsible for any belongings or valuables.               Contacts, dentures or bridgework may not be worn into surgery.  Leave your suitcase in the car. After surgery it may be brought to your room.  For patients admitted to the  hospital, discharge time is determined by your treatment team.  _  Patients discharged the day of surgery will not be allowed to drive home.  You will need someone to drive you home and stay with you the night of your procedure.    Please read over the following fact sheets that you were given:   [facility] Preparing for Surgery  _x___ TAKE THE FOLLOWING MEDICATION THE MORNING OF SURGERY WITH A SMALL SIP OF WATER. These include:  1. EFFEXOR  2. PRILOSEC  3. TAKE A PRILOSEC THE NIGHT BEFORE YOUR SURGERY  4. YOU MAY TAKE XANAX DAY OF SURGERY IF NEEDED  5.  6.  ____Fleets enema or Magnesium Citrate as directed.   _x___ Use CHG Soap or sage wipes as directed on instruction sheet   ____ Use inhalers on the day of surgery and bring to hospital day of surgery  _X___ Stop Metformin 2 days prior to surgery-LAST DOSE ON Friday, November 29TH   ____ Take 1/2 of usual insulin dose the night before surgery and none on the morning surgery.   ____ Follow recommendations from Cardiologist, Pulmonologist or PCP regarding stopping Aspirin, Coumadin, Plavix ,Eliquis, Effient, or Pradaxa, and Pletal.  ____Stop Anti-inflammatories such as Advil, Aleve, Ibuprofen, Motrin, Naproxen, Naprosyn, Goodies powders or aspirin products NOW-OK to  take Tylenol    ____ Stop supplements until after surgery.    ____ Bring C-Pap to the hospital.

## 2018-09-23 ENCOUNTER — Telehealth: Payer: Self-pay | Admitting: *Deleted

## 2018-09-23 ENCOUNTER — Other Ambulatory Visit: Payer: Self-pay | Admitting: General Surgery

## 2018-09-23 ENCOUNTER — Encounter
Admission: RE | Admit: 2018-09-23 | Discharge: 2018-09-23 | Disposition: A | Discharge status: Home / Self Care | Payer: 59 | Source: Ambulatory Visit | Attending: General Surgery | Admitting: General Surgery

## 2018-09-23 DIAGNOSIS — Z01818 Encounter for other preprocedural examination: Secondary | ICD-10-CM | POA: Insufficient documentation

## 2018-09-23 DIAGNOSIS — I10 Essential (primary) hypertension: Secondary | ICD-10-CM

## 2018-09-23 LAB — BASIC METABOLIC PANEL
Anion gap: 9 (ref 5–15)
BUN: 17 mg/dL (ref 6–20)
CHLORIDE: 99 mmol/L (ref 98–111)
CO2: 32 mmol/L (ref 22–32)
Calcium: 9.3 mg/dL (ref 8.9–10.3)
Creatinine, Ser: 0.66 mg/dL (ref 0.44–1.00)
GFR calc Af Amer: >60 mL/min (ref >60)
Glucose, Bld: 106 mg/dL — ABNORMAL HIGH (ref 70–99)
POTASSIUM: 3.4 mmol/L — AB (ref 3.5–5.1)
SODIUM: 140 mmol/L (ref 135–145)

## 2018-09-23 MED ORDER — POTASSIUM CHLORIDE ER 10 MEQ PO TBCR: 20.0000 meq | EXTENDED_RELEASE_TABLET | Freq: Two times a day (BID) | ORAL | 0 refills | Status: DC

## 2018-09-23 NOTE — Telephone Encounter (Signed)
-----   Message from Mickie Kay sent at 09/23/2018  3:02 PM EST ----- Please advise patient. See Dr Dwyane Luo note.

## 2018-09-23 NOTE — Telephone Encounter (Signed)
Patient notified of labs.   The patient is aware a prescription for potassium has been called in and aware to begin today, take twice daily, and continue until surgery.

## 2018-09-28 ENCOUNTER — Ambulatory Visit: Payer: Commercial Managed Care - HMO | Admitting: Registered Nurse

## 2018-09-28 ENCOUNTER — Ambulatory Visit
Admission: RE | Admit: 2018-09-28 | Discharge: 2018-09-28 | Disposition: A | Discharge status: Home / Self Care | Payer: Commercial Managed Care - HMO | Source: Ambulatory Visit | Attending: General Surgery | Admitting: General Surgery

## 2018-09-28 ENCOUNTER — Encounter
Admission: RE | Disposition: A | Discharge status: Home / Self Care | Payer: Self-pay | Source: Ambulatory Visit | Attending: General Surgery

## 2018-09-28 ENCOUNTER — Other Ambulatory Visit: Payer: Self-pay

## 2018-09-28 ENCOUNTER — Encounter
Admission: RE | Admit: 2018-09-28 | Discharge: 2018-09-28 | Disposition: A | Discharge status: Home / Self Care | Payer: Commercial Managed Care - HMO | Source: Ambulatory Visit | Attending: General Surgery | Admitting: General Surgery

## 2018-09-28 DIAGNOSIS — F419 Anxiety disorder, unspecified: Secondary | ICD-10-CM | POA: Diagnosis not present

## 2018-09-28 DIAGNOSIS — C50912 Malignant neoplasm of unspecified site of left female breast: Secondary | ICD-10-CM

## 2018-09-28 DIAGNOSIS — K219 Gastro-esophageal reflux disease without esophagitis: Secondary | ICD-10-CM | POA: Insufficient documentation

## 2018-09-28 DIAGNOSIS — I10 Essential (primary) hypertension: Secondary | ICD-10-CM | POA: Diagnosis not present

## 2018-09-28 DIAGNOSIS — Z7984 Long term (current) use of oral hypoglycemic drugs: Secondary | ICD-10-CM | POA: Insufficient documentation

## 2018-09-28 DIAGNOSIS — C50412 Malignant neoplasm of upper-outer quadrant of left female breast: Secondary | ICD-10-CM | POA: Diagnosis not present

## 2018-09-28 DIAGNOSIS — Z6841 Body Mass Index (BMI) 40.0 and over, adult: Secondary | ICD-10-CM | POA: Diagnosis not present

## 2018-09-28 DIAGNOSIS — Z79899 Other long term (current) drug therapy: Secondary | ICD-10-CM | POA: Diagnosis not present

## 2018-09-28 DIAGNOSIS — E785 Hyperlipidemia, unspecified: Secondary | ICD-10-CM | POA: Insufficient documentation

## 2018-09-28 DIAGNOSIS — E119 Type 2 diabetes mellitus without complications: Secondary | ICD-10-CM | POA: Diagnosis not present

## 2018-09-28 DIAGNOSIS — D242 Benign neoplasm of left breast: Secondary | ICD-10-CM | POA: Insufficient documentation

## 2018-09-28 HISTORY — PX: SENTINEL NODE BIOPSY: SHX6608

## 2018-09-28 HISTORY — PX: BREAST LUMPECTOMY: SHX2

## 2018-09-28 HISTORY — PX: BREAST LUMPECTOMY WITH NEEDLE LOCALIZATION: SHX5759

## 2018-09-28 LAB — GLUCOSE, CAPILLARY
Glucose-Capillary: 113 mg/dL — ABNORMAL HIGH (ref 70–99)
Glucose-Capillary: 129 mg/dL — ABNORMAL HIGH (ref 70–99)

## 2018-09-28 SURGERY — BREAST LUMPECTOMY WITH NEEDLE LOCALIZATION
Anesthesia: General | Laterality: Left

## 2018-09-28 MED ORDER — DEXAMETHASONE SODIUM PHOSPHATE 10 MG/ML IJ SOLN
INTRAMUSCULAR | Status: AC
  Filled 2018-09-28: qty 1

## 2018-09-28 MED ORDER — LIDOCAINE HCL (CARDIAC) PF 100 MG/5ML IV SOSY
PREFILLED_SYRINGE | INTRAVENOUS | Status: DC | PRN
  Administered 2018-09-28: 80 mg via INTRAVENOUS

## 2018-09-28 MED ORDER — PROPOFOL 10 MG/ML IV BOLUS
INTRAVENOUS | Status: DC | PRN
  Administered 2018-09-28: 120 mg via INTRAVENOUS

## 2018-09-28 MED ORDER — METHYLENE BLUE 0.5 % INJ SOLN
INTRAVENOUS | Status: DC | PRN
  Administered 2018-09-28: 5 mL via SUBMUCOSAL

## 2018-09-28 MED ORDER — ROCURONIUM BROMIDE 100 MG/10ML IV SOLN
INTRAVENOUS | Status: DC | PRN
  Administered 2018-09-28: 20 mg via INTRAVENOUS

## 2018-09-28 MED ORDER — FENTANYL CITRATE (PF) 100 MCG/2ML IJ SOLN
25.0000 ug | INTRAMUSCULAR | Status: DC | PRN
  Administered 2018-09-28 (×2): 50 ug via INTRAVENOUS

## 2018-09-28 MED ORDER — FENTANYL CITRATE (PF) 100 MCG/2ML IJ SOLN
INTRAMUSCULAR | Status: AC
  Administered 2018-09-28: 50 ug via INTRAVENOUS
  Filled 2018-09-28: qty 2

## 2018-09-28 MED ORDER — SUGAMMADEX SODIUM 200 MG/2ML IV SOLN
INTRAVENOUS | Status: AC
  Filled 2018-09-28: qty 2

## 2018-09-28 MED ORDER — TECHNETIUM TC 99M SULFUR COLLOID FILTERED
1.0000 | Freq: Once | INTRAVENOUS | Status: AC | PRN
  Administered 2018-09-28: 0.848 via INTRADERMAL

## 2018-09-28 MED ORDER — SUCCINYLCHOLINE CHLORIDE 20 MG/ML IJ SOLN
INTRAMUSCULAR | Status: DC | PRN
  Administered 2018-09-28: 100 mg via INTRAVENOUS

## 2018-09-28 MED ORDER — MIDAZOLAM HCL 2 MG/2ML IJ SOLN
INTRAMUSCULAR | Status: AC
  Filled 2018-09-28: qty 2

## 2018-09-28 MED ORDER — SUGAMMADEX SODIUM 200 MG/2ML IV SOLN
INTRAVENOUS | Status: DC | PRN
  Administered 2018-09-28: 200 mg via INTRAVENOUS

## 2018-09-28 MED ORDER — MIDAZOLAM HCL 2 MG/2ML IJ SOLN
INTRAMUSCULAR | Status: DC | PRN
  Administered 2018-09-28: 2 mg via INTRAVENOUS

## 2018-09-28 MED ORDER — FENTANYL CITRATE (PF) 100 MCG/2ML IJ SOLN
INTRAMUSCULAR | Status: AC
  Filled 2018-09-28: qty 2

## 2018-09-28 MED ORDER — SODIUM CHLORIDE 0.9 % IV SOLN
INTRAVENOUS | Status: DC
  Administered 2018-09-28: 13:00:00 via INTRAVENOUS

## 2018-09-28 MED ORDER — FENTANYL CITRATE (PF) 100 MCG/2ML IJ SOLN
INTRAMUSCULAR | Status: DC | PRN
  Administered 2018-09-28: 25 ug via INTRAVENOUS
  Administered 2018-09-28: 50 ug via INTRAVENOUS
  Administered 2018-09-28: 100 ug via INTRAVENOUS
  Administered 2018-09-28: 25 ug via INTRAVENOUS

## 2018-09-28 MED ORDER — HYDROCODONE-ACETAMINOPHEN 5-325 MG PO TABS: 1.0000 | ORAL_TABLET | ORAL | Status: DC | PRN

## 2018-09-28 MED ORDER — ACETAMINOPHEN 10 MG/ML IV SOLN
INTRAVENOUS | Status: AC
  Filled 2018-09-28: qty 100

## 2018-09-28 MED ORDER — ONDANSETRON HCL 4 MG/2ML IJ SOLN
INTRAMUSCULAR | Status: AC
  Filled 2018-09-28: qty 2

## 2018-09-28 MED ORDER — PROMETHAZINE HCL 25 MG/ML IJ SOLN: 6.2500 mg | INTRAMUSCULAR | Status: DC | PRN

## 2018-09-28 MED ORDER — DEXAMETHASONE SODIUM PHOSPHATE 10 MG/ML IJ SOLN
INTRAMUSCULAR | Status: DC | PRN
  Administered 2018-09-28: 8 mg via INTRAVENOUS

## 2018-09-28 MED ORDER — ACETAMINOPHEN 10 MG/ML IV SOLN
INTRAVENOUS | Status: DC | PRN
  Administered 2018-09-28: 1000 mg via INTRAVENOUS

## 2018-09-28 MED ORDER — ONDANSETRON HCL 4 MG/2ML IJ SOLN
INTRAMUSCULAR | Status: DC | PRN
  Administered 2018-09-28: 4 mg via INTRAVENOUS

## 2018-09-28 MED ORDER — METHYLENE BLUE 0.5 % INJ SOLN
INTRAVENOUS | Status: AC
  Filled 2018-09-28: qty 10

## 2018-09-28 MED ORDER — HYDROCODONE-ACETAMINOPHEN 5-325 MG PO TABS: 1.0000 | ORAL_TABLET | ORAL | 0 refills | Status: DC | PRN

## 2018-09-28 MED ORDER — PROPOFOL 10 MG/ML IV BOLUS
INTRAVENOUS | Status: AC
  Filled 2018-09-28: qty 20

## 2018-09-28 SURGICAL SUPPLY — 51 items
BINDER BREAST LRG (GAUZE/BANDAGES/DRESSINGS) IMPLANT
BINDER BREAST MEDIUM (GAUZE/BANDAGES/DRESSINGS) IMPLANT
BINDER BREAST XLRG (GAUZE/BANDAGES/DRESSINGS) ×2 IMPLANT
BINDER BREAST XXLRG (GAUZE/BANDAGES/DRESSINGS) IMPLANT
BLADE BOVIE TIP EXT 4 (BLADE) ×2 IMPLANT
BLADE SURG 15 STRL SS SAFETY (BLADE) ×4 IMPLANT
CANISTER SUCT 1200ML W/VALVE (MISCELLANEOUS) ×2 IMPLANT
CHLORAPREP W/TINT 26ML (MISCELLANEOUS) ×4 IMPLANT
CNTNR SPEC 2.5X3XGRAD LEK (MISCELLANEOUS)
CONT SPEC 4OZ STER OR WHT (MISCELLANEOUS)
CONTAINER SPEC 2.5X3XGRAD LEK (MISCELLANEOUS) IMPLANT
COVER PROBE FLX POLY STRL (MISCELLANEOUS) ×2 IMPLANT
COVER WAND RF STERILE (DRAPES) IMPLANT
DEVICE DUBIN SPECIMEN MAMMOGRA (MISCELLANEOUS) ×2 IMPLANT
DRAPE CHEST BREAST 77X106 FENE (MISCELLANEOUS) ×2 IMPLANT
DRAPE LAPAROTOMY 100X77 ABD (DRAPES) ×2 IMPLANT
DRSG GAUZE FLUFF 36X18 (GAUZE/BANDAGES/DRESSINGS) ×2 IMPLANT
DRSG TELFA 4X3 1S NADH ST (GAUZE/BANDAGES/DRESSINGS) ×4 IMPLANT
ELECT CAUTERY BLADE TIP 2.5 (TIP) ×2
ELECT REM PT RETURN 9FT ADLT (ELECTROSURGICAL) ×2
ELECTRODE CAUTERY BLDE TIP 2.5 (TIP) ×1 IMPLANT
ELECTRODE REM PT RTRN 9FT ADLT (ELECTROSURGICAL) ×1 IMPLANT
GLOVE BIO SURGEON STRL SZ7.5 (GLOVE) ×4 IMPLANT
GLOVE INDICATOR 8.0 STRL GRN (GLOVE) ×4 IMPLANT
GOWN STRL REUS W/ TWL LRG LVL3 (GOWN DISPOSABLE) ×2 IMPLANT
GOWN STRL REUS W/TWL LRG LVL3 (GOWN DISPOSABLE) ×2
KIT TURNOVER KIT A (KITS) ×2 IMPLANT
LABEL OR SOLS (LABEL) ×2 IMPLANT
MARGIN MAP 10MM (MISCELLANEOUS) ×2 IMPLANT
NDL SAFETY ECLIPSE 18X1.5 (NEEDLE) ×1 IMPLANT
NEEDLE HYPO 18GX1.5 SHARP (NEEDLE) ×1
NEEDLE HYPO 22GX1.5 SAFETY (NEEDLE) ×2 IMPLANT
NEEDLE HYPO 25X1 1.5 SAFETY (NEEDLE) ×2 IMPLANT
PACK BASIN MINOR ARMC (MISCELLANEOUS) ×2 IMPLANT
RETRACTOR RING XSMALL (MISCELLANEOUS) ×1 IMPLANT
RTRCTR WOUND ALEXIS 13CM XS SH (MISCELLANEOUS) ×2
SLEVE PROBE SENORX GAMMA FIND (MISCELLANEOUS) ×2 IMPLANT
STRIP CLOSURE SKIN 1/2X4 (GAUZE/BANDAGES/DRESSINGS) ×2 IMPLANT
SUT ETHILON 3-0 FS-10 30 BLK (SUTURE) ×2
SUT VIC AB 2-0 CT1 27 (SUTURE) ×1
SUT VIC AB 2-0 CT1 TAPERPNT 27 (SUTURE) ×1 IMPLANT
SUT VIC AB 2-0 SH 27 (SUTURE) ×1
SUT VIC AB 2-0 SH 27XBRD (SUTURE) ×1 IMPLANT
SUT VIC AB 3-0 SH 27 (SUTURE) ×1
SUT VIC AB 3-0 SH 27X BRD (SUTURE) ×1 IMPLANT
SUT VIC AB 4-0 FS2 27 (SUTURE) ×4 IMPLANT
SUTURE EHLN 3-0 FS-10 30 BLK (SUTURE) ×1 IMPLANT
SWABSTK COMLB BENZOIN TINCTURE (MISCELLANEOUS) ×2 IMPLANT
SYR 10ML LL (SYRINGE) ×2 IMPLANT
TAPE TRANSPORE STRL 2 31045 (GAUZE/BANDAGES/DRESSINGS) ×2 IMPLANT
WATER STERILE IRR 1000ML POUR (IV SOLUTION) ×2 IMPLANT

## 2018-09-28 NOTE — Anesthesia Preprocedure Evaluation (Signed)
Anesthesia Evaluation  Patient identified by MRN, date of birth, ID band Patient awake    Reviewed: Allergy & Precautions, H&P , NPO status , Patient's Chart, lab work & pertinent test results, reviewed documented beta blocker date and time   History of Anesthesia Complications Negative for: history of anesthetic complications  Airway Mallampati: III  TM Distance: >3 FB Neck ROM: full    Dental  (+) Implants, Dental Advidsory Given, Missing, Teeth Intact   Pulmonary neg pulmonary ROS,           Cardiovascular Exercise Tolerance: Good hypertension, (-) angina(-) CAD, (-) Past MI, (-) Cardiac Stents and (-) CABG (-) dysrhythmias (-) Valvular Problems/Murmurs     Neuro/Psych PSYCHIATRIC DISORDERS Anxiety negative neurological ROS     GI/Hepatic Neg liver ROS, GERD  ,  Endo/Other  diabetesMorbid obesity  Renal/GU negative Renal ROS  negative genitourinary   Musculoskeletal   Abdominal   Peds  Hematology negative hematology ROS (+)   Anesthesia Other Findings Past Medical History: No date: Anxiety No date: Cancer ([place]) No date: Diabetes mellitus without complication ([facility]) No date: GERD (gastroesophageal reflux disease) No date: Hyperlipidemia No date: Hypertension   Reproductive/Obstetrics negative OB ROS                             Anesthesia Physical Anesthesia Plan  ASA: III  Anesthesia Plan: General   Post-op Pain Management:    Induction: Intravenous  PONV Risk Score and Plan: 3 and Ondansetron, Dexamethasone, Midazolam, Promethazine and Treatment may vary due to age or medical condition  Airway Management Planned: Oral ETT  Additional Equipment:   Intra-op Plan:   Post-operative Plan: Extubation in OR  Informed Consent: I have reviewed the patients History and Physical, chart, labs and discussed the procedure including the risks, benefits and alternatives for the  proposed anesthesia with the patient or authorized representative who has indicated his/her understanding and acceptance.   Dental Advisory Given  Plan Discussed with: Anesthesiologist, CRNA and Surgeon  Anesthesia Plan Comments:         Anesthesia Quick Evaluation

## 2018-09-28 NOTE — Discharge Instructions (Signed)

## 2018-09-28 NOTE — OR Nursing (Signed)
Per Dr. Bary Castilla via tele [phone], 0k to discharge pt to home without his visit to postop.

## 2018-09-28 NOTE — Anesthesia Procedure Notes (Signed)
Procedure Name: Intubation Date/Time: 09/28/2018 1:43 PM Performed by: Hedda Slade, CRNA Pre-anesthesia Checklist: Patient identified, Patient being monitored, Timeout performed, Emergency Drugs available and Suction available Patient Re-evaluated:Patient Re-evaluated prior to induction Oxygen Delivery Method: Circle system utilized Preoxygenation: Pre-oxygenation with 100% oxygen Induction Type: IV induction Ventilation: Mask ventilation without difficulty Laryngoscope Size: Mac and 3 Grade View: Grade I Tube type: Oral Tube size: 7.0 mm Number of attempts: 1 Airway Equipment and Method: Stylet Placement Confirmation: ETT inserted through vocal cords under direct vision,  positive ETCO2 and breath sounds checked- equal and bilateral Secured at: 21 cm Tube secured with: Tape Dental Injury: Teeth and Oropharynx as per pre-operative assessment

## 2018-09-28 NOTE — Op Note (Signed)
Preoperative diagnosis: Left breast cancer.  Postoperative diagnosis: Same.  Operative procedure: Left breast wide excision with sentinel node biopsy.  Operating Surgeon: Hervey Ard, MD.  Anesthesia: General endotracheal.  Marcaine 0.25% with 1 to 200,000 units of epinephrine, 30 cc.  Estimated blood loss: 10 cc.  Clinical note: This 57 year old woman was recently identified with a left breast cancer.  She desired breast conservation.  She underwent wire localization prior to the procedure.  This was important as films show that the previously placed clip had migrated approximately 7 cm inferior to the site of previous distortion.  The patient underwent general anesthesia without difficulty.  The area of the areola was cleansed with alcohol and a total of 5 cc of 0.5% methylene blue was injected.  The patient previously undergone injection with technetium sulfur colloid.    Operative note: The left breast chest and axilla was cleansed with ChloraPrep and draped.  Ultrasound was used to show the area of distortion previously noted post biopsy at her preop visit in the office.  This corresponded to the localizing wire placed earlier in the day.  Local anesthesia was infiltrated and a curvilinear incision was in the 12 to 2 o'clock position was made carried down through skin subtenons tissue approximately 7 cm from the nipple.  Of note the breast had been taped inferiorly and laterally to provide better exposure to the area prior to skin preparation.  The localizing wire was brought into the wound and an [place] wound protector placed.  A 5 x 5 x 5 cm block of tissue was excised orientated and specimen radiograph compared confirm the intact wire.  The clip, as expected, was not identified.  Pathology reported the inflammatory process post biopsy was within a millimeter of the anterior and inferior edges.  It was elected to defer further excision pending permanent sections.  While the breast  specimen was being processed attention was turned to the axilla.  The node seeker device was used an area of increased uptake identified.  A transverse incision was made after infiltration of local anesthesia.  The skin was incised sharply and remaining dissection completed with electrocautery.  The [place] wound protector was brought to the axillary wound to provide better exposure.  3 hot, non-blue nodes were identified with counts ranging from 20 508,000.  Some adipose tissue removed with lymph nodes was sent separately as axillary fat.  Scanning after the nodes were removed showed counts less than 100.  Hemostasis here was with electrocautery and 3-0 Vicryl ties.  The axillary envelope was closed with a 2-0 Vicryl figure-of-eight suture.  The adipose tissue was closed in layers with 2-0 Vicryl figure-of-eight sutures.  The skin was closed with a running 4-0 Vicryl subcuticular suture.  Attention was returned to the wide excision site.  Here the superficial layer of breast parenchyma was approximated with interrupted 2-0 Vicryl figure-of-eight sutures followed by multiple layers and the adipose tissue.  This allowed a small cavity in the deep tissue for likely MammoSite placement should the lymph node to be negative.The skin was closed with a running 4-0 Vicryl subcuticular suture fluff gauze and a compressive wrap were applied.    The patient tolerated the procedure well and was taken to the recovery room in stable condition.

## 2018-09-28 NOTE — Transfer of Care (Signed)
Immediate Anesthesia Transfer of Care Note  Patient: Angela Glass  Procedure(s) Performed: BREAST LUMPECTOMY WITH NEEDLE LOCALIZATION (Left ) SENTINEL NODE BIOPSY (Left )  Patient Location: PACU  Anesthesia Type:General  Level of Consciousness: awake, alert  and oriented  Airway & Oxygen Therapy: Patient Spontanous Breathing and Patient connected to face mask oxygen  Post-op Assessment: Report given to RN and Post -op Vital signs reviewed and stable  Post vital signs: Reviewed and stable  Last Vitals:  Vitals Value Taken Time  BP 111/73 09/28/2018  3:21 PM  Temp 36.2 C 09/28/2018  3:21 PM  Pulse 77 09/28/2018  3:21 PM  Resp 26 09/28/2018  3:21 PM  SpO2 98 % 09/28/2018  3:21 PM  Vitals shown include unvalidated device data.  Last Pain:  Vitals:   09/28/18 1223  TempSrc: Temporal  PainSc: 0-No pain         Complications: No apparent anesthesia complications

## 2018-09-28 NOTE — Anesthesia Post-op Follow-up Note (Signed)
Anesthesia QCDR form completed.        

## 2018-09-28 NOTE — H&P (Signed)
No change in clinical history or exam. Wire localization films reviewed with the radiologist. For wide excision, SLN exam of the left breast.

## 2018-09-29 ENCOUNTER — Encounter: Payer: Self-pay | Admitting: General Surgery

## 2018-09-29 NOTE — Anesthesia Postprocedure Evaluation (Signed)
Anesthesia Post Note  Patient: Angela Glass  Procedure(s) Performed: BREAST LUMPECTOMY WITH NEEDLE LOCALIZATION (Left ) SENTINEL NODE BIOPSY (Left )  Patient location during evaluation: PACU Anesthesia Type: General Level of consciousness: awake and alert Pain management: pain level controlled Vital Signs Assessment: post-procedure vital signs reviewed and stable Respiratory status: spontaneous breathing, nonlabored ventilation, respiratory function stable and patient connected to nasal cannula oxygen Cardiovascular status: blood pressure returned to baseline and stable Postop Assessment: no apparent nausea or vomiting Anesthetic complications: no     Last Vitals:  Vitals:   09/28/18 1621 09/28/18 1642  BP: 126/68 137/76  Pulse: 72 73  Resp: (!) 21 16  Temp: 36.5 C 36.5 C  SpO2: 93% 95%    Last Pain:  Vitals:   09/29/18 0821  TempSrc:   PainSc: 0-No pain                 Martha Clan

## 2018-10-01 ENCOUNTER — Telehealth: Payer: Self-pay | Admitting: General Surgery

## 2018-10-01 LAB — SURGICAL PATHOLOGY

## 2018-10-01 NOTE — Telephone Encounter (Signed)
Notified path fine. Node negative. Margins fine.  Reports doing well. F/U next week to discuss radiation options.

## 2018-10-06 ENCOUNTER — Ambulatory Visit (INDEPENDENT_AMBULATORY_CARE_PROVIDER_SITE_OTHER): Payer: 59

## 2018-10-06 ENCOUNTER — Ambulatory Visit
Admission: RE | Admit: 2018-10-06 | Discharge: 2018-10-06 | Disposition: A | Discharge status: Home / Self Care | Payer: 59 | Source: Ambulatory Visit | Attending: Radiation Oncology | Admitting: Radiation Oncology

## 2018-10-06 ENCOUNTER — Encounter: Payer: Self-pay | Admitting: General Surgery

## 2018-10-06 ENCOUNTER — Encounter: Payer: Self-pay | Admitting: Oncology

## 2018-10-06 ENCOUNTER — Ambulatory Visit (INDEPENDENT_AMBULATORY_CARE_PROVIDER_SITE_OTHER): Payer: 59 | Admitting: General Surgery

## 2018-10-06 ENCOUNTER — Inpatient Hospital Stay: Payer: Commercial Managed Care - HMO | Attending: Oncology | Admitting: Oncology

## 2018-10-06 ENCOUNTER — Other Ambulatory Visit: Payer: Self-pay

## 2018-10-06 VITALS — BP 110/64 | HR 78 | Resp 16 | Ht 60.0 in | Wt 220.0 lb

## 2018-10-06 VITALS — BP 129/83 | HR 80 | Temp 97.5°F | Resp 18 | Ht 60.0 in | Wt 221.0 lb

## 2018-10-06 DIAGNOSIS — Z170 Estrogen receptor positive status [ER+]: Secondary | ICD-10-CM | POA: Insufficient documentation

## 2018-10-06 DIAGNOSIS — C50912 Malignant neoplasm of unspecified site of left female breast: Secondary | ICD-10-CM | POA: Diagnosis not present

## 2018-10-06 DIAGNOSIS — Z7189 Other specified counseling: Secondary | ICD-10-CM | POA: Insufficient documentation

## 2018-10-06 DIAGNOSIS — C50412 Malignant neoplasm of upper-outer quadrant of left female breast: Secondary | ICD-10-CM | POA: Insufficient documentation

## 2018-10-06 NOTE — Consult Note (Signed)
NEW PATIENT EVALUATION  Name: Angela Glass  MRN: [id number]  Date:   10/06/2018     DOB: 1960/10/31   This 57 y.o. female patient presents to the clinic for initial evaluation of stage I (T1 BN 0 M0).ER/PR positive invasive lobular carcinoma status post wide local excision and sentinel node biopsy  REFERRING PHYSICIAN: Sindy Guadeloupe, MD  CHIEF COMPLAINT:  Chief Complaint  Patient presents with  . Breast Cancer    DIAGNOSIS: The encounter diagnosis was Invasive lobular carcinoma of breast, stage 1, left ([place]).   PREVIOUS INVESTIGATIONS:  Pathology reports reviewed Mammograms and ultrasound and MRI scan reviewed Clinical notes reviewed  HPI: patient is a 57 year old female who presented with an abnormal mammogram of her left breast showing area of persistent distortion in the upper outer quadrant of the left breast.she underwent stereotactic guided biopsy which was positive for lobular carcinoma. MRI scan was performed showing known lesion in the upper outer quadrant left breast measuring 2.8 cm maximum dimension.she went on to have a wide local excision left breast showing 2 lesions one 6 mm one 3 mm in greatest dimension. There was lobular carcinoma in situ present. There is also an intraductal papilloma with associated sclerosis 6S adenosis and microcalcifications.overall grade was 1. Margins were clear at 3 mm for the invasive component.3 sentinel lymph nodes were examined all negative for malignancy. Tumor was ER/PR positive. She has been declined for chemotherapy by medical oncology. Patient had an ultrasound of her breast status post lumpectomy showing a fit cavity for MammoSite balloon placement. She seen today for radiation oncology opinion. She is doing well. She specifically denies breast tenderness cough or bone pain.   PLANNED TREATMENT REGIMEN: and accelerated partial breast radiation  PAST MEDICAL HISTORY:  has a past medical history of Anxiety, Cancer ([place]),  Diabetes mellitus without complication ([place]), GERD (gastroesophageal reflux disease), Hyperlipidemia, and Hypertension.    PAST SURGICAL HISTORY:  Past Surgical History:  Procedure Laterality Date  . BREAST BIOPSY Left 08/17/2018   Affirm bx(ribbon clip), invasive lobular carcinoma  . BREAST LUMPECTOMY Left 09/28/2018  . BREAST LUMPECTOMY WITH NEEDLE LOCALIZATION Left 09/28/2018   Procedure: BREAST LUMPECTOMY WITH NEEDLE LOCALIZATION;  Surgeon: Robert Bellow, MD;  Location: [facility] ORS;  Service: General;  Laterality: Left;  . MOUTH SURGERY     DENTAL IMPLANT AND GUM SURGERY  . SENTINEL NODE BIOPSY Left 09/28/2018   Procedure: SENTINEL NODE BIOPSY;  Surgeon: Robert Bellow, MD;  Location: [facility] ORS;  Service: General;  Laterality: Left;    FAMILY HISTORY: family history includes COPD in her mother; Healthy in her brother and father; Heart attack (age of onset: 4) in her brother; Lung cancer in her maternal uncle, maternal uncle, and paternal grandfather; Throat cancer (age of onset: 53) in her mother.  SOCIAL HISTORY:  reports that she has never smoked. She has never used smokeless tobacco. She reports that she drinks alcohol. She reports that she does not use drugs.  ALLERGIES: Lisinopril-hydrochlorothiazide  MEDICATIONS:  Current Outpatient Medications  Medication Sig Dispense Refill  . ALPRAZolam (XANAX) 0.5 MG tablet Take 0.5 mg by mouth daily as needed for anxiety.     Marland Kitchen losartan-hydrochlorothiazide (HYZAAR) 100-12.5 MG tablet Take 1 tablet by mouth daily.     . metFORMIN (GLUCOPHAGE) 500 MG tablet Take 500 mg by mouth daily with breakfast.     . omeprazole (PRILOSEC) 40 MG capsule Take 40 mg by mouth daily as needed (for acid reflux).   4  .  rosuvastatin (CRESTOR) 10 MG tablet Take 10 mg by mouth at bedtime.     Marland Kitchen venlafaxine XR (EFFEXOR-XR) 37.5 MG 24 hr capsule Take 112.5 mg by mouth daily with breakfast.   3   No current facility-administered medications for this  encounter.     ECOG PERFORMANCE STATUS:  0 - Asymptomatic  REVIEW OF SYSTEMS:  Patient denies any weight loss, fatigue, weakness, fever, chills or night sweats. Patient denies any loss of vision, blurred vision. Patient denies any ringing  of the ears or hearing loss. No irregular heartbeat. Patient denies heart murmur or history of fainting. Patient denies any chest pain or pain radiating to her upper extremities. Patient denies any shortness of breath, difficulty breathing at night, cough or hemoptysis. Patient denies any swelling in the lower legs. Patient denies any nausea vomiting, vomiting of blood, or coffee ground material in the vomitus. Patient denies any stomach pain. Patient states has had normal bowel movements no significant constipation or diarrhea. Patient denies any dysuria, hematuria or significant nocturia. Patient denies any problems walking, swelling in the joints or loss of balance. Patient denies any skin changes, loss of hair or loss of weight. Patient denies any excessive worrying or anxiety or significant depression. Patient denies any problems with insomnia. Patient denies excessive thirst, polyuria, polydipsia. Patient denies any swollen glands, patient denies easy bruising or easy bleeding. Patient denies any recent infections, allergies or URI. Patient "s visual fields have not changed significantly in recent time.    PHYSICAL EXAM: There were no vitals taken for this visit. Left breast is wide local excision scar and sentinel node scar both healing well no dominant mass or nodularity is noted in either breast in 2 positions examined. No axillary or supraclavicular adenopathy is appreciated.Well-developed well-nourished patient in NAD. HEENT reveals PERLA, EOMI, discs not visualized.  Oral cavity is clear. No oral mucosal lesions are identified. Neck is clear without evidence of cervical or supraclavicular adenopathy. Lungs are clear to A&P. Cardiac examination is essentially  unremarkable with regular rate and rhythm without murmur rub or thrill. Abdomen is benign with no organomegaly or masses noted. Motor sensory and DTR levels are equal and symmetric in the upper and lower extremities. Cranial nerves II through XII are grossly intact. Proprioception is intact. No peripheral adenopathy or edema is identified. No motor or sensory levels are noted. Crude visual fields are within normal range.  LABORATORY DATA: pathology reports reviewed    RADIOLOGY RESULTS:mammograms ultrasound and MRI scans all reviewed and compatible with the above-stated findings   IMPRESSION: stage I invasive lobular carcinoma of the left breast as was wide local excision and sentinel node biopsy ER/PR positive in 57 year old female  PLAN: at this time I have reviewed the literature. Most recent data on accelerated partial breast radiation and lobular carcinoma shows excellent long-term results equal to invasive mammary carcinoma. I believe she would be a good candidate based on extremely large size of her breasts which would cause undue side effects such as skin reaction. I have recommended placement of a balloon catheter and then adjuvant high-dose rate remote afterloading. Risks and benefits of treatment including problems with balloon placement minor skin reaction fatigue permanentthickness of the lumpectomy site from high-dose radiation all were discussed in detail with the patient. We will coordinate MammoSite balloon placement and treatment planning with our staff and Dr. Barbette Hair office. Patient, per has been treatment plan well. Patient will be candidate for antiestrogen therapy after completion of radiation.  I would like  to take this opportunity to thank you for allowing me to participate in the care of your patient.[name], MD

## 2018-10-06 NOTE — Progress Notes (Signed)
No new changes noted today 

## 2018-10-06 NOTE — Patient Instructions (Addendum)
The patient is aware to call back for any questions or new concerns.  

## 2018-10-06 NOTE — Progress Notes (Signed)
Patient ID: Angela Glass, female   DOB: [birth year], 59 y.o.   MRN: [id number]  Chief Complaint  Patient presents with  . Routine Post Op    HPI Angela Glass is a 57 y.o. female.  Here for postoperative visit left lumpectomy with SLN biopsy on 09-28-18. She states she is doing well, tender, nut no pain.    HPI  Past Medical History:  Diagnosis Date  . Anxiety   . Cancer ([place])   . Diabetes mellitus without complication ([place])   . GERD (gastroesophageal reflux disease)   . Hyperlipidemia   . Hypertension     Past Surgical History:  Procedure Laterality Date  . BREAST BIOPSY Left 08/17/2018   Affirm bx(ribbon clip), invasive lobular carcinoma  . BREAST LUMPECTOMY Left 09/28/2018  . BREAST LUMPECTOMY WITH NEEDLE LOCALIZATION Left 09/28/2018   Procedure: BREAST LUMPECTOMY WITH NEEDLE LOCALIZATION;  Surgeon: Angela Bellow, MD;  Location: [facility] ORS;  Service: General;  Laterality: Left;  . MOUTH SURGERY     DENTAL IMPLANT AND GUM SURGERY  . SENTINEL NODE BIOPSY Left 09/28/2018   Procedure: SENTINEL NODE BIOPSY;  Surgeon: Angela Bellow, MD;  Location: [facility] ORS;  Service: General;  Laterality: Left;    Family History  Problem Relation Age of Onset  . Throat cancer Mother 32  . COPD Mother   . Healthy Father   . Healthy Brother   . Lung cancer Maternal Uncle   . Lung cancer Paternal Grandfather   . Heart attack Brother 54  . Lung cancer Maternal Uncle   . Breast cancer Neg Hx     Social History Social History   Tobacco Use  . Smoking status: Never Smoker  . Smokeless tobacco: Never Used  Substance Use Topics  . Alcohol use: Yes    Comment: ocassional  . Drug use: Never    Allergies  Allergen Reactions  . Lisinopril-Hydrochlorothiazide Cough    Current Outpatient Medications  Medication Sig Dispense Refill  . ALPRAZolam (XANAX) 0.5 MG tablet Take 0.5 mg by mouth daily as needed for anxiety.     Marland Kitchen losartan-hydrochlorothiazide (HYZAAR)  100-12.5 MG tablet Take 1 tablet by mouth daily.     . metFORMIN (GLUCOPHAGE) 500 MG tablet Take 500 mg by mouth daily with breakfast.     . omeprazole (PRILOSEC) 40 MG capsule Take 40 mg by mouth daily as needed (for acid reflux).   4  . rosuvastatin (CRESTOR) 10 MG tablet Take 10 mg by mouth at bedtime.     Marland Kitchen venlafaxine XR (EFFEXOR-XR) 37.5 MG 24 hr capsule Take 112.5 mg by mouth daily with breakfast.   3   No current facility-administered medications for this visit.     Review of Systems Review of Systems  Constitutional: Negative.   Respiratory: Negative.   Cardiovascular: Negative.     Blood pressure 110/64, pulse 78, resp. rate 16, height 5' (1.524 m), weight 220 lb (99.8 kg), SpO2 98 %.  Physical Exam Physical Exam  Constitutional: She is oriented to person, place, and time. She appears well-developed and well-nourished.  Pulmonary/Chest:  Left breast lumpectomy incision healing     Neurological: She is alert and oriented to person, place, and time.  Skin: Skin is warm and dry.  Psychiatric: Her behavior is normal.    Data Reviewed A. BREAST, LEFT, UPPER OUTER QUADRANT; WIDE EXCISION:  - INVASIVE LOBULAR CARCINOMA (2 LESIONS, 6 MM AND 3 MM IN GREATEST  DIMENSIONS RESPECTIVELY).  - LOBULAR CARCINOMA IN SITU.  -  INTRADUCTAL PAPILLOMA ASSOCIATED WITH SCLEROSING ADENOSIS AND  MICROCALCIFICATION.  - FIBROCYSTIC CHANGES AND MICROCALCIFICATIONS.  - PREVIOUS BIOPSY SITE RELATED CHANGES AND SCAR.  SEE CANCER SUMMARY.   Cancer case summary: Invasive carcinoma of the breast  Procedure: Wide Excision  Specimen laterality: Left  Tumor size: 6 x 6 x 6 mm and 3 x 2 x 45mm  Histologic type: invasive lobular carcinoma  Histologic grade ([place] histologic score)  Glandular (acinar)/tubular differentiation: 3  Nuclear pleomorphism: 1  Mitotic rate: 1  Overall grade: 1  Ductal Carcinoma in Situ (DCIS): not identified  Margins:  Invasive carcinoma margins: uninvolved by  invasive carcinoma, closest  margin anterior, 3 mm.   September 01, 2018 bilateral breast MRI showed a single foci of abnormality in the upper outer quadrant of the left breast.  Ultrasound examination completed today to determine if the patient would be a candidate for accelerated partial breast radiation shows a small seroma cavity approximately 1.4 x 2.0 x 2.2 cm.  This is approximately a 2 cm depth.  A more superficial seroma cavity, unrelated to the original excision site extends to within 0.86 cm of the skin.  It is feasible to place a MammoSite balloon within the original site of wide excision.  Assessment    Doing well post wide excision and sentinel node biopsy.    Plan    Follow up pending review by radiation oncology later today.  Appointments with Radiation Oncology, Dr Angela Glass and Medical Oncology, Dr Angela Glass are today. The patient is aware to call back for any questions or new concerns.  If whole breast radiation is planned we will plan for a follow-up examination here in 1 month.      HPI, Physical Exam, Assessment and Plan have been scribed under the direction and in the presence of Angela Bellow, MD. Angela Fetch, RN I have completed the exam and reviewed the above documentation for accuracy and completeness.  I agree with the above.  Haematologist has been used and any errors in dictation or transcription are unintentional.  Angela Glass, M.D., F.A.C.S.   Angela Glass 10/06/2018, 9:44 AM

## 2018-10-06 NOTE — Progress Notes (Signed)
Hematology/Oncology Consult note [facility]  [phone] [phone]  Patient Care Team: Marinda Elk, MD as PCP - General (Physician Assistant)   Name of the patient: Angela Glass  [id number]  1961-06-08   Date of visit: 10/06/18  Diagnosis-pathological prognostic stage I A p T1b [id number] grade 1 invasive lobular carcinoma ER PR positive and HER-2/neu negative status post lumpectomy  Chief complaint/ Reason for visit-discuss final pathology results and further management  Heme/Onc history: patient is a 57 year old female who has been getting routine screening mammograms and her most recent mammogram in October 2019 showed a possible distortion in the left breast which was biopsied and showed invasive lobular carcinoma grade 1 strongly ER PR positive HER-2/neu negative 6 mm.  Patient also had an MRI which showed non-mass enhancement of 2.8 x 1.5 x 0.8 cm in the upper outer quadrant of the left breast.  No abnormal appearing lymph nodes.   Interval history-patient is doing well status post lumpectomy and reports no significant complaints today.  ECOG PS- 0 Pain scale- 0 Opioid associated constipation- no  Review of systems- Review of Systems  Constitutional: Negative for chills, fever, malaise/fatigue and weight loss.  HENT: Negative for congestion, ear discharge and nosebleeds.   Eyes: Negative for blurred vision.  Respiratory: Negative for cough, hemoptysis, sputum production, shortness of breath and wheezing.   Cardiovascular: Negative for chest pain, palpitations, orthopnea and claudication.  Gastrointestinal: Negative for abdominal pain, blood in stool, constipation, diarrhea, heartburn, melena, nausea and vomiting.  Genitourinary: Negative for dysuria, flank pain, frequency, hematuria and urgency.  Musculoskeletal: Negative for back pain, joint pain and myalgias.  Skin: Negative for rash.  Neurological: Negative for  dizziness, tingling, focal weakness, seizures, weakness and headaches.  Endo/Heme/Allergies: Does not bruise/bleed easily.  Psychiatric/Behavioral: Negative for depression and suicidal ideas. The patient does not have insomnia.        Allergies  Allergen Reactions  . Lisinopril-Hydrochlorothiazide Cough     Past Medical History:  Diagnosis Date  . Anxiety   . Cancer ([place])   . Diabetes mellitus without complication ([place])   . GERD (gastroesophageal reflux disease)   . Hyperlipidemia   . Hypertension      Past Surgical History:  Procedure Laterality Date  . BREAST BIOPSY Left 08/17/2018   Affirm bx(ribbon clip), invasive lobular carcinoma  . BREAST LUMPECTOMY Left 09/28/2018  . BREAST LUMPECTOMY WITH NEEDLE LOCALIZATION Left 09/28/2018   Procedure: BREAST LUMPECTOMY WITH NEEDLE LOCALIZATION;  Surgeon: Robert Bellow, MD;  Location: [facility] ORS;  Service: General;  Laterality: Left;  . MOUTH SURGERY     DENTAL IMPLANT AND GUM SURGERY  . SENTINEL NODE BIOPSY Left 09/28/2018   Procedure: SENTINEL NODE BIOPSY;  Surgeon: Robert Bellow, MD;  Location: [facility] ORS;  Service: General;  Laterality: Left;    Social History   Socioeconomic History  . Marital status: Widowed    Spouse name: Not on file  . Number of children: Not on file  . Years of education: Not on file  . Highest education level: Not on file  Occupational History  . Not on file  Social Needs  . Financial resource strain: Not on file  . Food insecurity:    Worry: Not on file    Inability: Not on file  . Transportation needs:    Medical: Not on file    Non-medical: Not on file  Tobacco Use  . Smoking status: Never Smoker  . Smokeless tobacco: Never  Used  Substance and Sexual Activity  . Alcohol use: Yes    Comment: ocassional  . Drug use: Never  . Sexual activity: Not on file  Lifestyle  . Physical activity:    Days per week: Not on file    Minutes per session: Not on file  . Stress: Not on file    Relationships  . Social connections:    Talks on phone: Not on file    Gets together: Not on file    Attends religious service: Not on file    Active member of club or organization: Not on file    Attends meetings of clubs or organizations: Not on file    Relationship status: Not on file  . Intimate partner violence:    Fear of current or ex partner: Not on file    Emotionally abused: Not on file    Physically abused: Not on file    Forced sexual activity: Not on file  Other Topics Concern  . Not on file  Social History Narrative  . Not on file    Family History  Problem Relation Age of Onset  . Throat cancer Mother 31  . COPD Mother   . Healthy Father   . Healthy Brother   . Lung cancer Maternal Uncle   . Lung cancer Paternal Grandfather   . Heart attack Brother 75  . Lung cancer Maternal Uncle   . Breast cancer Neg Hx      Current Outpatient Medications:  .  losartan-hydrochlorothiazide (HYZAAR) 100-12.5 MG tablet, Take 1 tablet by mouth daily. , Disp: , Rfl:  .  metFORMIN (GLUCOPHAGE) 500 MG tablet, Take 500 mg by mouth daily with breakfast. , Disp: , Rfl:  .  omeprazole (PRILOSEC) 40 MG capsule, Take 40 mg by mouth daily as needed (for acid reflux). , Disp: , Rfl: 4 .  rosuvastatin (CRESTOR) 10 MG tablet, Take 10 mg by mouth at bedtime. , Disp: , Rfl:  .  venlafaxine XR (EFFEXOR-XR) 37.5 MG 24 hr capsule, Take 112.5 mg by mouth daily with breakfast. , Disp: , Rfl: 3 .  ALPRAZolam (XANAX) 0.5 MG tablet, Take 0.5 mg by mouth daily as needed for anxiety. , Disp: , Rfl:   Physical exam:  Vitals:   10/06/18 1012  BP: 129/83  Pulse: 80  Resp: 18  Temp: (!) 97.5 F (36.4 C)  TempSrc: Tympanic  SpO2: 95%  Weight: 221 lb (100.2 kg)  Height: 5' (1.524 m)   Physical Exam  Constitutional: She is oriented to person, place, and time. She appears well-developed and well-nourished.  HENT:  Head: Normocephalic and atraumatic.  Eyes: Pupils are equal, round, and reactive  to light. EOM are normal.  Neck: Normal range of motion.  Cardiovascular: Normal rate, regular rhythm and normal heart sounds.  Pulmonary/Chest: Effort normal and breath sounds normal.  Abdominal: Soft. Bowel sounds are normal.  Neurological: She is alert and oriented to person, place, and time.  Skin: Skin is warm and dry.   Breast exam was performed in seated and lying down position. Patient is status post left lumpectomy with a well-healed surgical scar. No evidence of any palpable masses. No evidence of axillary adenopathy. No evidence of any palpable masses or lumps in the right breast. No evidence of right axillary adenopathy    CMP Latest Ref Rng & Units 09/23/2018  Glucose 70 - 99 mg/dL 106(H)  BUN 6 - 20 mg/dL 17  Creatinine 0.44 - 1.00 mg/dL 0.66  Sodium  135 - 145 mmol/L 140  Potassium 3.5 - 5.1 mmol/L 3.4(L)  Chloride 98 - 111 mmol/L 99  CO2 22 - 32 mmol/L 32  Calcium 8.9 - 10.3 mg/dL 9.3   No flowsheet data found.  No images are attached to the encounter.  Nm Sentinel Node Injection  Result Date: 09/28/2018 CLINICAL DATA:  Left   breast cancer. EXAM: NUCLEAR MEDICINE BREAST LYMPHOSCINTIGRAPHY LEFT TECHNIQUE: The procedure, risks (including but not limited to bleeding, infection, organ damage ), benefits, and alternatives were explained to the patient. Questions regarding the procedure were encouraged and answered. The patient understands and consents to the procedure. Laterality was confirmed and marked. Intradermal injection of radiopharmaceutical was performed at the 12 o'clock, 3 o'clock, 6 o'clock, and 9 o'clock positions around the left nipple. The patient was then sent to the operating room where the sentinel node(s) were identified and removed by the surgeon. RADIOPHARMACEUTICALS:  Total of 0.848 mCi Millipore-filtered Technetium-21msulfur colloid, injected in four equal aliquots. IMPRESSION: Uncomplicated intradermal injection of a total of 0.848 mCi Technetium-973msulfur colloid for purposes of sentinel node identification. Electronically Signed   By: D Lucrezia Europe.D.   On: 09/28/2018 13:33   Mm Breast Surgical Specimen  Result Date: 09/28/2018 CLINICAL DATA:  [age]ear old female with left breast invasive lobular carcinoma. EXAM: SPECIMEN RADIOGRAPH OF THE LEFT BREAST COMPARISON:  Previous exam(s). FINDINGS: Status post excision of the left breast. The wire tip is present and marked for pathology. The clip is not visualized as migration had occurred and it was no longer at the site of biopsied malignancy. IMPRESSION: Specimen radiograph of the left breast. Electronically Signed   By: SeKristopher Oppenheim.D.   On: 09/28/2018 14:33   UsKoreareast Complete Uni Left Inc Axilla  Result Date: 10/06/2018 Ultrasound examination completed today to determine if the patient would be a candidate for accelerated partial breast radiation shows a small seroma cavity approximately 1.4 x 2.0 x 2.2 cm.  This is approximately a 2 cm depth.  A more superficial seroma cavity, unrelated to the original excision site extends to within 0.86 cm of the skin.  It is feasible to place a MammoSite balloon within the original site of wide excision.   UsKoreareast Complete Uni Left Inc Axilla  Result Date: 09/08/2018 Ultrasound examination of the left breast at the 12 o'clock position, 7 cm from the nipple showed an ill-defined irregular area measuring 1.72 x 1.94 x 1.95.  This may well correspond to the bite site although a clip is not well identified.  Correlation with post biopsy mammogram appropriate versus proceeding with localization.  BI-RADS-6.  Mm Left Plc Breast Loc Dev   1st Lesion  Inc Mammo Guide  Result Date: 09/28/2018 CLINICAL DATA:  [age]ear old female with biopsy proven left breast invasive lobular carcinoma. EXAM: NEEDLE LOCALIZATION OF THE LEFT BREAST WITH MAMMO GUIDANCE COMPARISON:  Previous exams. FINDINGS: Patient presents for needle localization prior to left breast  lumpectomy. I met with the patient and we discussed the procedure of needle localization including benefits and alternatives. We discussed the high likelihood of a successful procedure. We discussed the risks of the procedure, including infection, bleeding, tissue injury, and further surgery. Informed, written consent was given. The usual time-out protocol was performed immediately prior to the procedure. Using mammographic guidance, sterile technique, 1% lidocaine and a 7 cm modified Kopans needle, left breast distortion was localized using superior approach. The images were marked for Dr. ByBary CastillaOf note, the patient is ribbon  shaped clip demonstrates inferior migration. The site of biopsied distortion was localized for surgery. IMPRESSION: Needle localization left breast. No apparent complications. Electronically Signed   By: Kristopher Oppenheim M.D.   On: 09/28/2018 11:45     Assessment and plan- Patient is a 57 y.o. female with pathological prognostic stage IA pT1b pN0 cM0 invasive lobular carcinoma grade 1 ER PR positive HER-2/neu negative status post lumpectomy  I discussed the results of the final pathology with the patient in detail.  Final pathology showed 2 discrete grade 1 invasive lobular cancers one was 6 mm and the other one was 3 mm, no lymphovascular invasion with negative margins.  Although there was a non-mass enhancement of 2.4 cm seen on her initial mammogram this did not pan out to be more than 1 cm on final pathology.  There was also no associated DCIS.  Given that her overall tumor was less than 1 cm and grade 1 she does not meet Lovena Le Rex criteria to undergo Oncotype testing and as such will not benefit from adjuvant chemotherapy.  She will be seen by radiation oncology later today to discuss the role of adjuvant radiation treatment.  Given that her tumor was ER PR positive hormone therapy is indicated at this time.  Idiscussed the role for hormone therapy. Given that she is  postmenopausal I would favor 5 years of adjuvant hormone therapy with aromatase inhibitor. I discussed the risks and benefits of Arimidex including all but not limited to fatigue, hypercholesterolemia, hot flashes, arthralgias and worsening bone health.  Patient will also need to be on calcium 1200 mg along with vitamin D 800 international units.  We will obtain a baseline bone density scan written information about Arimidex given to the patient. I would like her to finish radiation therapy and start hormone therapy thereafter. Patient verbalized understanding and agrees to proceed.  This will be given with a curative intent.  We will plan to start her on Arimidex when she calls [place] closer to her end of radiation.  I will tentatively see her in mid April with a CMP to see how she is tolerating her Arimidex  Cancer Staging Invasive lobular carcinoma of breast, stage 1, left ([place]) Staging form: Breast, AJCC 8th Edition - Clinical: Stage IB (cT2, cN0, cM0, G1, ER+, PR+, HER2-) - Signed by Sindy Guadeloupe, MD on 10/06/2018 - Pathologic stage from 10/06/2018: Stage IA (pT1b, pN0, cM0, G1, ER+, PR+, HER2-) - Signed by Sindy Guadeloupe, MD on 10/06/2018     Visit Diagnosis 1. Invasive lobular carcinoma of breast, stage 1, left ([place])   2. Goals of care, counseling/discussion      Dr. Randa Evens, MD, MPH [facility] at [facility] [phone] 10/06/2018 3:32 PM

## 2018-10-14 ENCOUNTER — Telehealth: Payer: Self-pay | Admitting: *Deleted

## 2018-10-14 NOTE — Telephone Encounter (Signed)
Mammosite schedule. Placement  11-17-18  at [facility] at 1:00 Scan 11-19-18 Treat Jan 24, 27-30 Aware the [facility] will be calling her for more details Aware of ATB and directions reviewed. Aware no showers and to wear her bra while mammosite in place.

## 2018-10-15 MED ORDER — CEFADROXIL 500 MG PO CAPS: 500.0000 mg | ORAL_CAPSULE | Freq: Two times a day (BID) | ORAL | 0 refills | Status: DC

## 2018-10-15 NOTE — Telephone Encounter (Signed)
Spoke with patient and reviewed instructions. Aware to take first does of antibiotics one hour prior to procedure. Antibiotics sent into her pharmacy.

## 2018-10-15 NOTE — Telephone Encounter (Signed)
Please call patient back

## 2018-10-30 ENCOUNTER — Other Ambulatory Visit: Payer: Self-pay | Admitting: General Surgery

## 2018-11-05 ENCOUNTER — Ambulatory Visit: Payer: 59 | Admitting: General Surgery

## 2018-11-05 ENCOUNTER — Telehealth: Payer: Self-pay | Admitting: *Deleted

## 2018-11-05 NOTE — Telephone Encounter (Signed)
Mammosite appointment cancelled per Dr Bary Castilla. She appreciates call and aware Dr Baruch Gouty staff will be calling to discuss whole breast radiation.

## 2018-11-09 ENCOUNTER — Ambulatory Visit
Admission: RE | Admit: 2018-11-09 | Discharge: 2018-11-09 | Disposition: A | Discharge status: Home / Self Care | Payer: 59 | Source: Ambulatory Visit | Attending: Radiation Oncology | Admitting: Radiation Oncology

## 2018-11-09 DIAGNOSIS — C50412 Malignant neoplasm of upper-outer quadrant of left female breast: Secondary | ICD-10-CM | POA: Diagnosis present

## 2018-11-11 DIAGNOSIS — C50412 Malignant neoplasm of upper-outer quadrant of left female breast: Secondary | ICD-10-CM | POA: Diagnosis not present

## 2018-11-13 ENCOUNTER — Other Ambulatory Visit: Payer: Self-pay | Admitting: *Deleted

## 2018-11-13 DIAGNOSIS — C50912 Malignant neoplasm of unspecified site of left female breast: Secondary | ICD-10-CM

## 2018-11-16 ENCOUNTER — Ambulatory Visit
Admission: RE | Admit: 2018-11-16 | Discharge: 2018-11-16 | Disposition: A | Discharge status: Home / Self Care | Payer: 59 | Source: Ambulatory Visit | Attending: Radiation Oncology | Admitting: Radiation Oncology

## 2018-11-16 ENCOUNTER — Ambulatory Visit: Payer: 59

## 2018-11-16 DIAGNOSIS — C50412 Malignant neoplasm of upper-outer quadrant of left female breast: Secondary | ICD-10-CM | POA: Diagnosis not present

## 2018-11-17 ENCOUNTER — Ambulatory Visit: Payer: 59 | Admitting: General Surgery

## 2018-11-17 ENCOUNTER — Ambulatory Visit: Payer: 59

## 2018-11-17 ENCOUNTER — Ambulatory Visit
Admission: RE | Admit: 2018-11-17 | Discharge: 2018-11-17 | Disposition: A | Discharge status: Home / Self Care | Payer: 59 | Source: Ambulatory Visit | Attending: Radiation Oncology | Admitting: Radiation Oncology

## 2018-11-17 DIAGNOSIS — C50412 Malignant neoplasm of upper-outer quadrant of left female breast: Secondary | ICD-10-CM | POA: Diagnosis not present

## 2018-11-18 ENCOUNTER — Ambulatory Visit: Payer: 59

## 2018-11-18 ENCOUNTER — Ambulatory Visit
Admission: RE | Admit: 2018-11-18 | Discharge: 2018-11-18 | Disposition: A | Discharge status: Home / Self Care | Payer: 59 | Source: Ambulatory Visit | Attending: Radiation Oncology | Admitting: Radiation Oncology

## 2018-11-18 DIAGNOSIS — C50412 Malignant neoplasm of upper-outer quadrant of left female breast: Secondary | ICD-10-CM | POA: Diagnosis not present

## 2018-11-19 ENCOUNTER — Ambulatory Visit: Payer: 59

## 2018-11-19 ENCOUNTER — Ambulatory Visit: Payer: Commercial Managed Care - HMO

## 2018-11-19 ENCOUNTER — Ambulatory Visit
Admission: RE | Admit: 2018-11-19 | Discharge: 2018-11-19 | Disposition: A | Discharge status: Home / Self Care | Payer: 59 | Source: Ambulatory Visit | Attending: Radiation Oncology | Admitting: Radiation Oncology

## 2018-11-19 DIAGNOSIS — C50412 Malignant neoplasm of upper-outer quadrant of left female breast: Secondary | ICD-10-CM | POA: Diagnosis not present

## 2018-11-20 ENCOUNTER — Ambulatory Visit: Payer: 59

## 2018-11-20 ENCOUNTER — Ambulatory Visit: Payer: Commercial Managed Care - HMO

## 2018-11-20 ENCOUNTER — Ambulatory Visit
Admission: RE | Admit: 2018-11-20 | Discharge: 2018-11-20 | Disposition: A | Discharge status: Home / Self Care | Payer: 59 | Source: Ambulatory Visit | Attending: Radiation Oncology | Admitting: Radiation Oncology

## 2018-11-20 DIAGNOSIS — C50412 Malignant neoplasm of upper-outer quadrant of left female breast: Secondary | ICD-10-CM | POA: Diagnosis not present

## 2018-11-23 ENCOUNTER — Ambulatory Visit: Payer: Commercial Managed Care - HMO

## 2018-11-23 ENCOUNTER — Ambulatory Visit: Payer: 59

## 2018-11-23 ENCOUNTER — Ambulatory Visit
Admission: RE | Admit: 2018-11-23 | Discharge: 2018-11-23 | Disposition: A | Discharge status: Home / Self Care | Payer: 59 | Source: Ambulatory Visit | Attending: Radiation Oncology | Admitting: Radiation Oncology

## 2018-11-23 DIAGNOSIS — C50412 Malignant neoplasm of upper-outer quadrant of left female breast: Secondary | ICD-10-CM | POA: Diagnosis not present

## 2018-11-24 ENCOUNTER — Ambulatory Visit: Payer: Commercial Managed Care - HMO

## 2018-11-24 ENCOUNTER — Ambulatory Visit
Admission: RE | Admit: 2018-11-24 | Discharge: 2018-11-24 | Disposition: A | Discharge status: Home / Self Care | Payer: 59 | Source: Ambulatory Visit | Attending: Radiation Oncology | Admitting: Radiation Oncology

## 2018-11-24 ENCOUNTER — Ambulatory Visit: Payer: 59

## 2018-11-24 DIAGNOSIS — C50412 Malignant neoplasm of upper-outer quadrant of left female breast: Secondary | ICD-10-CM | POA: Diagnosis not present

## 2018-11-25 ENCOUNTER — Ambulatory Visit: Payer: Commercial Managed Care - HMO

## 2018-11-25 ENCOUNTER — Ambulatory Visit
Admission: RE | Admit: 2018-11-25 | Discharge: 2018-11-25 | Disposition: A | Discharge status: Home / Self Care | Payer: 59 | Source: Ambulatory Visit | Attending: Radiation Oncology | Admitting: Radiation Oncology

## 2018-11-25 ENCOUNTER — Ambulatory Visit: Payer: 59

## 2018-11-25 DIAGNOSIS — C50412 Malignant neoplasm of upper-outer quadrant of left female breast: Secondary | ICD-10-CM | POA: Diagnosis not present

## 2018-11-26 ENCOUNTER — Ambulatory Visit
Admission: RE | Admit: 2018-11-26 | Discharge: 2018-11-26 | Disposition: A | Discharge status: Home / Self Care | Payer: 59 | Source: Ambulatory Visit | Attending: Radiation Oncology | Admitting: Radiation Oncology

## 2018-11-26 ENCOUNTER — Ambulatory Visit: Payer: Commercial Managed Care - HMO

## 2018-11-26 ENCOUNTER — Inpatient Hospital Stay: Payer: 59 | Attending: Oncology

## 2018-11-26 ENCOUNTER — Ambulatory Visit: Payer: 59

## 2018-11-26 DIAGNOSIS — C50412 Malignant neoplasm of upper-outer quadrant of left female breast: Secondary | ICD-10-CM | POA: Diagnosis not present

## 2018-11-26 DIAGNOSIS — C50912 Malignant neoplasm of unspecified site of left female breast: Secondary | ICD-10-CM

## 2018-11-26 LAB — CBC
HCT: 40.5 % (ref 36.0–46.0)
Hemoglobin: 13.1 g/dL (ref 12.0–15.0)
MCH: 27.6 pg (ref 26.0–34.0)
MCHC: 32.3 g/dL (ref 30.0–36.0)
MCV: 85.4 fL (ref 80.0–100.0)
PLATELETS: 227 10*3/uL (ref 150–400)
RBC: 4.74 MIL/uL (ref 3.87–5.11)
RDW: 12.8 % (ref 11.5–15.5)
WBC: 10.2 10*3/uL (ref 4.0–10.5)
nRBC: 0 % (ref 0.0–0.2)

## 2018-11-27 ENCOUNTER — Ambulatory Visit: Payer: 59

## 2018-11-27 ENCOUNTER — Ambulatory Visit
Admission: RE | Admit: 2018-11-27 | Discharge: 2018-11-27 | Disposition: A | Discharge status: Home / Self Care | Payer: 59 | Source: Ambulatory Visit | Attending: Radiation Oncology | Admitting: Radiation Oncology

## 2018-11-27 DIAGNOSIS — C50412 Malignant neoplasm of upper-outer quadrant of left female breast: Secondary | ICD-10-CM | POA: Diagnosis not present

## 2018-11-30 ENCOUNTER — Ambulatory Visit
Admission: RE | Admit: 2018-11-30 | Discharge: 2018-11-30 | Disposition: A | Discharge status: Home / Self Care | Payer: 59 | Source: Ambulatory Visit | Attending: Radiation Oncology | Admitting: Radiation Oncology

## 2018-11-30 DIAGNOSIS — C50412 Malignant neoplasm of upper-outer quadrant of left female breast: Secondary | ICD-10-CM | POA: Diagnosis not present

## 2018-12-01 ENCOUNTER — Ambulatory Visit
Admission: RE | Admit: 2018-12-01 | Discharge: 2018-12-01 | Disposition: A | Discharge status: Home / Self Care | Payer: 59 | Source: Ambulatory Visit | Attending: Radiation Oncology | Admitting: Radiation Oncology

## 2018-12-01 DIAGNOSIS — C50412 Malignant neoplasm of upper-outer quadrant of left female breast: Secondary | ICD-10-CM | POA: Diagnosis not present

## 2018-12-02 ENCOUNTER — Ambulatory Visit
Admission: RE | Admit: 2018-12-02 | Discharge: 2018-12-02 | Disposition: A | Discharge status: Home / Self Care | Payer: 59 | Source: Ambulatory Visit | Attending: Radiation Oncology | Admitting: Radiation Oncology

## 2018-12-02 DIAGNOSIS — C50412 Malignant neoplasm of upper-outer quadrant of left female breast: Secondary | ICD-10-CM | POA: Diagnosis not present

## 2018-12-03 ENCOUNTER — Ambulatory Visit
Admission: RE | Admit: 2018-12-03 | Discharge: 2018-12-03 | Disposition: A | Discharge status: Home / Self Care | Payer: 59 | Source: Ambulatory Visit | Attending: Radiation Oncology | Admitting: Radiation Oncology

## 2018-12-03 DIAGNOSIS — C50412 Malignant neoplasm of upper-outer quadrant of left female breast: Secondary | ICD-10-CM | POA: Diagnosis not present

## 2018-12-04 ENCOUNTER — Ambulatory Visit
Admission: RE | Admit: 2018-12-04 | Discharge: 2018-12-04 | Disposition: A | Discharge status: Home / Self Care | Payer: 59 | Source: Ambulatory Visit | Attending: Radiation Oncology | Admitting: Radiation Oncology

## 2018-12-04 DIAGNOSIS — C50412 Malignant neoplasm of upper-outer quadrant of left female breast: Secondary | ICD-10-CM | POA: Diagnosis not present

## 2018-12-07 ENCOUNTER — Ambulatory Visit
Admission: RE | Admit: 2018-12-07 | Discharge: 2018-12-07 | Disposition: A | Discharge status: Home / Self Care | Payer: 59 | Source: Ambulatory Visit | Attending: Radiation Oncology | Admitting: Radiation Oncology

## 2018-12-07 DIAGNOSIS — C50412 Malignant neoplasm of upper-outer quadrant of left female breast: Secondary | ICD-10-CM | POA: Diagnosis not present

## 2018-12-08 ENCOUNTER — Ambulatory Visit: Payer: 59

## 2018-12-08 DIAGNOSIS — C50412 Malignant neoplasm of upper-outer quadrant of left female breast: Secondary | ICD-10-CM | POA: Diagnosis not present

## 2018-12-09 ENCOUNTER — Ambulatory Visit: Payer: 59

## 2018-12-09 ENCOUNTER — Ambulatory Visit
Admission: RE | Admit: 2018-12-09 | Discharge: 2018-12-09 | Disposition: A | Discharge status: Home / Self Care | Payer: 59 | Source: Ambulatory Visit | Attending: Radiation Oncology | Admitting: Radiation Oncology

## 2018-12-09 DIAGNOSIS — C50412 Malignant neoplasm of upper-outer quadrant of left female breast: Secondary | ICD-10-CM | POA: Diagnosis not present

## 2018-12-10 ENCOUNTER — Ambulatory Visit: Payer: 59

## 2018-12-10 ENCOUNTER — Inpatient Hospital Stay: Payer: 59 | Attending: Oncology

## 2018-12-10 ENCOUNTER — Ambulatory Visit
Admission: RE | Admit: 2018-12-10 | Discharge: 2018-12-10 | Disposition: A | Discharge status: Home / Self Care | Payer: 59 | Source: Ambulatory Visit | Attending: Radiation Oncology | Admitting: Radiation Oncology

## 2018-12-10 DIAGNOSIS — C50412 Malignant neoplasm of upper-outer quadrant of left female breast: Secondary | ICD-10-CM | POA: Diagnosis not present

## 2018-12-10 DIAGNOSIS — C50912 Malignant neoplasm of unspecified site of left female breast: Secondary | ICD-10-CM

## 2018-12-10 LAB — COMPREHENSIVE METABOLIC PANEL
ALK PHOS: 49 U/L (ref 38–126)
ALT: 17 U/L (ref 0–44)
AST: 19 U/L (ref 15–41)
Albumin: 4.1 g/dL (ref 3.5–5.0)
Anion gap: 10 (ref 5–15)
BUN: 17 mg/dL (ref 6–20)
CO2: 28 mmol/L (ref 22–32)
Calcium: 9.5 mg/dL (ref 8.9–10.3)
Chloride: 101 mmol/L (ref 98–111)
Creatinine, Ser: 0.62 mg/dL (ref 0.44–1.00)
GFR calc Af Amer: >60 mL/min (ref >60)
GFR calc non Af Amer: >60 mL/min (ref >60)
Glucose, Bld: 124 mg/dL — ABNORMAL HIGH (ref 70–99)
Potassium: 3.7 mmol/L (ref 3.5–5.1)
Sodium: 139 mmol/L (ref 135–145)
Total Bilirubin: 0.4 mg/dL (ref 0.3–1.2)
Total Protein: 7.1 g/dL (ref 6.5–8.1)

## 2018-12-10 LAB — CBC
HCT: 40.9 % (ref 36.0–46.0)
Hemoglobin: 13.2 g/dL (ref 12.0–15.0)
MCH: 28 pg (ref 26.0–34.0)
MCHC: 32.3 g/dL (ref 30.0–36.0)
MCV: 86.7 fL (ref 80.0–100.0)
Platelets: 226 10*3/uL (ref 150–400)
RBC: 4.72 MIL/uL (ref 3.87–5.11)
RDW: 12.7 % (ref 11.5–15.5)
WBC: 8.5 10*3/uL (ref 4.0–10.5)
nRBC: 0 % (ref 0.0–0.2)

## 2018-12-11 ENCOUNTER — Ambulatory Visit: Payer: 59

## 2018-12-11 ENCOUNTER — Ambulatory Visit
Admission: RE | Admit: 2018-12-11 | Discharge: 2018-12-11 | Disposition: A | Discharge status: Home / Self Care | Payer: 59 | Source: Ambulatory Visit | Attending: Radiation Oncology | Admitting: Radiation Oncology

## 2018-12-11 DIAGNOSIS — C50412 Malignant neoplasm of upper-outer quadrant of left female breast: Secondary | ICD-10-CM | POA: Diagnosis not present

## 2018-12-14 ENCOUNTER — Ambulatory Visit: Payer: 59

## 2018-12-14 ENCOUNTER — Ambulatory Visit
Admission: RE | Admit: 2018-12-14 | Discharge: 2018-12-14 | Disposition: A | Discharge status: Home / Self Care | Payer: 59 | Source: Ambulatory Visit | Attending: Radiation Oncology | Admitting: Radiation Oncology

## 2018-12-14 DIAGNOSIS — C50412 Malignant neoplasm of upper-outer quadrant of left female breast: Secondary | ICD-10-CM | POA: Diagnosis not present

## 2018-12-15 ENCOUNTER — Ambulatory Visit: Payer: 59

## 2018-12-15 ENCOUNTER — Ambulatory Visit
Admission: RE | Admit: 2018-12-15 | Discharge: 2018-12-15 | Disposition: A | Discharge status: Home / Self Care | Payer: 59 | Source: Ambulatory Visit | Attending: Radiation Oncology | Admitting: Radiation Oncology

## 2018-12-15 DIAGNOSIS — C50412 Malignant neoplasm of upper-outer quadrant of left female breast: Secondary | ICD-10-CM | POA: Diagnosis not present

## 2018-12-16 ENCOUNTER — Ambulatory Visit: Payer: 59

## 2018-12-17 ENCOUNTER — Ambulatory Visit: Payer: 59

## 2018-12-18 ENCOUNTER — Ambulatory Visit: Payer: 59

## 2018-12-21 ENCOUNTER — Ambulatory Visit: Payer: 59

## 2018-12-22 ENCOUNTER — Ambulatory Visit: Payer: 59

## 2018-12-23 ENCOUNTER — Ambulatory Visit: Payer: 59

## 2018-12-23 ENCOUNTER — Other Ambulatory Visit: Payer: Self-pay | Admitting: *Deleted

## 2018-12-23 MED ORDER — ANASTROZOLE 1 MG PO TABS: 1.0000 mg | ORAL_TABLET | Freq: Every day | ORAL | 2 refills | Status: DC

## 2018-12-23 NOTE — Progress Notes (Unsigned)
arimidex

## 2018-12-24 ENCOUNTER — Ambulatory Visit: Payer: 59

## 2018-12-25 ENCOUNTER — Ambulatory Visit: Payer: 59

## 2018-12-28 ENCOUNTER — Telehealth: Payer: Self-pay

## 2018-12-28 ENCOUNTER — Ambulatory Visit: Payer: 59

## 2018-12-28 DIAGNOSIS — E782 Mixed hyperlipidemia: Secondary | ICD-10-CM | POA: Diagnosis not present

## 2018-12-28 DIAGNOSIS — R7303 Prediabetes: Secondary | ICD-10-CM | POA: Diagnosis not present

## 2018-12-28 DIAGNOSIS — I10 Essential (primary) hypertension: Secondary | ICD-10-CM | POA: Diagnosis not present

## 2018-12-28 NOTE — Telephone Encounter (Signed)
-----   Message from Robert Bellow, MD sent at 12/25/2018 11:38 AM EST ----- Please arrange a follow-up visit in March, status post wide excision in December 2019.  Thank you

## 2018-12-28 NOTE — Telephone Encounter (Signed)
Message left for patient to call to schedule follow up with Dr Bary Castilla in March.

## 2018-12-29 ENCOUNTER — Ambulatory Visit: Payer: 59

## 2018-12-30 ENCOUNTER — Ambulatory Visit: Payer: 59

## 2018-12-31 ENCOUNTER — Ambulatory Visit: Payer: 59

## 2019-01-01 ENCOUNTER — Ambulatory Visit: Payer: 59

## 2019-01-04 ENCOUNTER — Ambulatory Visit: Payer: 59

## 2019-01-04 DIAGNOSIS — E782 Mixed hyperlipidemia: Secondary | ICD-10-CM | POA: Diagnosis not present

## 2019-01-04 DIAGNOSIS — I10 Essential (primary) hypertension: Secondary | ICD-10-CM | POA: Diagnosis not present

## 2019-01-04 DIAGNOSIS — C50412 Malignant neoplasm of upper-outer quadrant of left female breast: Secondary | ICD-10-CM | POA: Diagnosis not present

## 2019-01-05 ENCOUNTER — Ambulatory Visit: Payer: 59

## 2019-01-14 ENCOUNTER — Encounter: Payer: Self-pay | Admitting: General Surgery

## 2019-01-14 ENCOUNTER — Ambulatory Visit: Payer: 59 | Admitting: General Surgery

## 2019-01-14 ENCOUNTER — Other Ambulatory Visit: Payer: Self-pay

## 2019-01-14 VITALS — BP 117/65 | HR 64 | Temp 96.4°F | Resp 16 | Ht 60.0 in | Wt 225.8 lb

## 2019-01-14 DIAGNOSIS — C50912 Malignant neoplasm of unspecified site of left female breast: Secondary | ICD-10-CM | POA: Diagnosis not present

## 2019-01-14 NOTE — Patient Instructions (Addendum)
Patient will need to return to the office in October, with a bilateral mammogram.    Call the office with any questions or concerns.

## 2019-01-14 NOTE — Progress Notes (Signed)
Patient ID: Angela Glass, female   DOB: June 02, 1961, 58 y.o.   MRN: [id number]  Chief Complaint  Patient presents with  . Follow-up     6 mo f/u left lumpectomy, sx 09/28/18    HPI Angela Glass is a 58 y.o. female here today for 3 month follow up for left breast lumpectomy. Patient states she is feeling well.  She started anastrozole after completing her whole breast radiation about 3 weeks ago and is tolerating this well.  HPI  Past Medical History:  Diagnosis Date  . Anxiety   . Breast cancer of upper-outer quadrant of left female breast ([place]) 11/29/2017   Invasive lobular carcinoma, 2 foci 6 mm and 3 mm, T1b, N0, ER/PR positive, HER-2/neu not overexpressed.  Whole breast radiation  . Diabetes mellitus without complication ([place])   . GERD (gastroesophageal reflux disease)   . Hyperlipidemia   . Hypertension     Past Surgical History:  Procedure Laterality Date  . BREAST BIOPSY Left 08/17/2018   Affirm bx(ribbon clip), invasive lobular carcinoma  . BREAST LUMPECTOMY WITH NEEDLE LOCALIZATION Left 09/28/2018   Procedure: BREAST LUMPECTOMY WITH NEEDLE LOCALIZATION;  Surgeon: Robert Bellow, MD;  Location: [facility] ORS;  Service: General;  Laterality: Left;  . MOUTH SURGERY     DENTAL IMPLANT AND GUM SURGERY  . SENTINEL NODE BIOPSY Left 09/28/2018   Procedure: SENTINEL NODE BIOPSY;  Surgeon: Robert Bellow, MD;  Location: [facility] ORS;  Service: General;  Laterality: Left;    Family History  Problem Relation Age of Onset  . Throat cancer Mother 21  . COPD Mother   . Healthy Father   . Healthy Brother   . Lung cancer Maternal Uncle   . Lung cancer Paternal Grandfather   . Heart attack Brother 85  . Lung cancer Maternal Uncle   . Breast cancer Neg Hx     Social History Social History   Tobacco Use  . Smoking status: Never Smoker  . Smokeless tobacco: Never Used  Substance Use Topics  . Alcohol use: Yes    Comment: ocassional  . Drug use: Never     Allergies  Allergen Reactions  . Lisinopril-Hydrochlorothiazide Cough    Current Outpatient Medications  Medication Sig Dispense Refill  . ALPRAZolam (XANAX) 0.5 MG tablet Take 0.5 mg by mouth daily as needed for anxiety.     Marland Kitchen anastrozole (ARIMIDEX) 1 MG tablet Take 1 tablet (1 mg total) by mouth daily. 30 tablet 2  . Calcium Carbonate-Vitamin D (CALCIUM HIGH POTENCY/VITAMIN D) 600-200 MG-UNIT TABS Take by mouth.    . losartan-hydrochlorothiazide (HYZAAR) 100-12.5 MG tablet Take 1 tablet by mouth daily.     . metFORMIN (GLUCOPHAGE) 500 MG tablet Take 500 mg by mouth daily with breakfast.     . omeprazole (PRILOSEC) 40 MG capsule Take 40 mg by mouth daily as needed (for acid reflux).   4  . potassium chloride (K-DUR) 10 MEQ tablet Take by mouth.    . rosuvastatin (CRESTOR) 10 MG tablet Take 10 mg by mouth at bedtime.     Marland Kitchen venlafaxine XR (EFFEXOR-XR) 37.5 MG 24 hr capsule Take 112.5 mg by mouth daily with breakfast.   3   No current facility-administered medications for this visit.     Review of Systems Review of Systems  Constitutional: Negative.   Respiratory: Negative.   Cardiovascular: Negative.     Blood pressure 117/65, pulse 64, temperature (!) 96.4 F (35.8 C), temperature source Temporal, resp. rate 16,  height 5' (1.524 m), weight 225 lb 12.8 oz (102.4 kg), SpO2 94 %.  Physical Exam Physical Exam Exam conducted with a chaperone present.  Constitutional:      Appearance: She is well-developed.  Eyes:     General: No scleral icterus.    Conjunctiva/sclera: Conjunctivae normal.  Neck:     Musculoskeletal: Normal range of motion.  Cardiovascular:     Rate and Rhythm: Normal rate and regular rhythm.     Heart sounds: Normal heart sounds.  Pulmonary:     Effort: Pulmonary effort is normal.     Breath sounds: Normal breath sounds.  Chest:     Breasts: Breasts are symmetrical.        Right: No inverted nipple, mass, nipple discharge, skin change or tenderness.         Left: No inverted nipple, mass, nipple discharge, skin change or tenderness.    Lymphadenopathy:     Cervical: No cervical adenopathy.     Upper Body:     Right upper body: No axillary adenopathy.     Left upper body: No supraclavicular or axillary adenopathy.  Skin:    General: Skin is warm and dry.  Neurological:     Mental Status: She is alert and oriented to person, place, and time.     Data Reviewed Radiation oncology notes.  Whole breast radiation.  Anastrozole prescribed by Dr. Janese Banks.  Assessment Doing well post wide excision and whole breast radiation.  Candidate for screening colonoscopy.  Plan Indication for colon screening based on her age were reviewed.  Options include high-sensitivity blood testing, Cologuard and colonoscopy.  Pros and cons of each were reviewed.  Patient was encouraged to take care of this use year either through this office or through her PCP.  Colonoscopy with possible biopsy/polypectomy prn: Information regarding the procedure, including its potential risks and complications (including but not limited to perforation of the bowel, which may require emergency surgery to repair, and bleeding) was verbally given to the patient.   The patient reports that she is tolerating her anastrozole without any effect whatsoever.  We will plan for a follow-up examination in October 2020 with bilateral mammograms at that time.  HPI, Physical Exam, Assessment and Plan have been scribed under the direction and in the presence of Hervey Ard, Md.  Eudelia Bunch R. Bobette Mo, CMA  I have completed the exam and reviewed the above documentation for accuracy and completeness.  I agree with the above.  Haematologist has been used and any errors in dictation or transcription are unintentional.  Hervey Ard, M.D., F.A.C.S.  [facility] Keayra Graham 01/14/2019, 3:53 PM

## 2019-01-20 ENCOUNTER — Other Ambulatory Visit: Payer: Self-pay

## 2019-01-21 ENCOUNTER — Other Ambulatory Visit: Payer: Self-pay

## 2019-01-21 ENCOUNTER — Encounter: Payer: Self-pay | Admitting: Radiation Oncology

## 2019-01-21 ENCOUNTER — Ambulatory Visit
Admission: RE | Admit: 2019-01-21 | Discharge: 2019-01-21 | Disposition: A | Discharge status: Home / Self Care | Payer: 59 | Source: Ambulatory Visit | Attending: Radiation Oncology | Admitting: Radiation Oncology

## 2019-01-21 VITALS — BP 137/85 | HR 83 | Temp 99.4°F | Resp 16 | Wt 223.8 lb

## 2019-01-21 DIAGNOSIS — Z923 Personal history of irradiation: Secondary | ICD-10-CM | POA: Insufficient documentation

## 2019-01-21 DIAGNOSIS — Z170 Estrogen receptor positive status [ER+]: Secondary | ICD-10-CM | POA: Insufficient documentation

## 2019-01-21 DIAGNOSIS — Z79811 Long term (current) use of aromatase inhibitors: Secondary | ICD-10-CM | POA: Insufficient documentation

## 2019-01-21 DIAGNOSIS — C3412 Malignant neoplasm of upper lobe, left bronchus or lung: Secondary | ICD-10-CM | POA: Diagnosis present

## 2019-01-21 DIAGNOSIS — C50912 Malignant neoplasm of unspecified site of left female breast: Secondary | ICD-10-CM

## 2019-01-21 NOTE — Progress Notes (Signed)
Radiation Oncology Follow up Note  Name: Angela Glass   Date:   01/21/2019 MRN:  [id number] DOB: 1961/07/09    This 58 y.o. female presents to the clinic today for one-month follow-up status post.hypofractionated radiation therapy to her left breast for stage I ER/PR positive invasive lobular carcinoma  REFERRING PROVIDER: Marinda Elk, MD  HPI: patient is a 58 year old female now seen out 1 month having completed whole breast radiation and the hypofractionated course of treatment for stage I ER/PR positive invasive lobular carcinoma. Seen today in routine follow-up she is doing well. She specifically denies breast tenderness cough or bone pain..she is started on arimadex is tolerating that well without side effect.  COMPLICATIONS OF TREATMENT: none  FOLLOW UP COMPLIANCE: keeps appointments   PHYSICAL EXAM:  BP 137/85 (BP Location: Left Arm, Patient Position: Sitting)   Pulse 83   Temp 99.4 F (37.4 C) (Tympanic)   Resp 16   Wt 223 lb 12.3 oz (101.5 kg)   BMI 43.70 kg/m  Lungs are clear to A&P cardiac examination essentially unremarkable with regular rate and rhythm. No dominant mass or nodularity is noted in either breast in 2 positions examined. Incision is well-healed. No axillary or supraclavicular adenopathy is appreciated. Cosmetic result is excellent.Well-developed well-nourished patient in NAD. HEENT reveals PERLA, EOMI, discs not visualized.  Oral cavity is clear. No oral mucosal lesions are identified. Neck is clear without evidence of cervical or supraclavicular adenopathy. Lungs are clear to A&P. Cardiac examination is essentially unremarkable with regular rate and rhythm without murmur rub or thrill. Abdomen is benign with no organomegaly or masses noted. Motor sensory and DTR levels are equal and symmetric in the upper and lower extremities. Cranial nerves II through XII are grossly intact. Proprioception is intact. No peripheral adenopathy or edema is  identified. No motor or sensory levels are noted. Crude visual fields are within normal range.  RADIOLOGY RESULTS: no current films for review  PLAN: present time she is doing extremely well 1 month out from radiation therapy. I'm please were overall progress. She continues on arimadex without side effect. I've asked to see her back in 4-5 months for follow-up. Patient is to call sooner with any concerns.  I would like to take this opportunity to thank you for allowing me to participate in the care of your patient.[name], MD

## 2019-02-11 ENCOUNTER — Other Ambulatory Visit: Payer: 59

## 2019-02-12 ENCOUNTER — Other Ambulatory Visit: Payer: Self-pay | Admitting: *Deleted

## 2019-02-12 DIAGNOSIS — C50912 Malignant neoplasm of unspecified site of left female breast: Secondary | ICD-10-CM

## 2019-02-15 ENCOUNTER — Other Ambulatory Visit: Payer: Self-pay

## 2019-02-15 ENCOUNTER — Inpatient Hospital Stay: Payer: 59 | Attending: Oncology

## 2019-02-15 DIAGNOSIS — Z79811 Long term (current) use of aromatase inhibitors: Secondary | ICD-10-CM | POA: Insufficient documentation

## 2019-02-15 DIAGNOSIS — Z8249 Family history of ischemic heart disease and other diseases of the circulatory system: Secondary | ICD-10-CM | POA: Insufficient documentation

## 2019-02-15 DIAGNOSIS — F419 Anxiety disorder, unspecified: Secondary | ICD-10-CM | POA: Insufficient documentation

## 2019-02-15 DIAGNOSIS — E119 Type 2 diabetes mellitus without complications: Secondary | ICD-10-CM | POA: Insufficient documentation

## 2019-02-15 DIAGNOSIS — Z888 Allergy status to other drugs, medicaments and biological substances status: Secondary | ICD-10-CM | POA: Insufficient documentation

## 2019-02-15 DIAGNOSIS — K219 Gastro-esophageal reflux disease without esophagitis: Secondary | ICD-10-CM | POA: Diagnosis not present

## 2019-02-15 DIAGNOSIS — I10 Essential (primary) hypertension: Secondary | ICD-10-CM | POA: Diagnosis not present

## 2019-02-15 DIAGNOSIS — Z801 Family history of malignant neoplasm of trachea, bronchus and lung: Secondary | ICD-10-CM | POA: Diagnosis not present

## 2019-02-15 DIAGNOSIS — Z7984 Long term (current) use of oral hypoglycemic drugs: Secondary | ICD-10-CM | POA: Insufficient documentation

## 2019-02-15 DIAGNOSIS — E785 Hyperlipidemia, unspecified: Secondary | ICD-10-CM | POA: Insufficient documentation

## 2019-02-15 DIAGNOSIS — C50912 Malignant neoplasm of unspecified site of left female breast: Secondary | ICD-10-CM

## 2019-02-15 DIAGNOSIS — Z79899 Other long term (current) drug therapy: Secondary | ICD-10-CM | POA: Diagnosis not present

## 2019-02-15 DIAGNOSIS — Z800 Family history of malignant neoplasm of digestive organs: Secondary | ICD-10-CM | POA: Diagnosis not present

## 2019-02-15 DIAGNOSIS — E876 Hypokalemia: Secondary | ICD-10-CM | POA: Diagnosis not present

## 2019-02-15 LAB — COMPREHENSIVE METABOLIC PANEL
ALT: 16 U/L (ref 0–44)
AST: 18 U/L (ref 15–41)
Albumin: 3.7 g/dL (ref 3.5–5.0)
Alkaline Phosphatase: 57 U/L (ref 38–126)
Anion gap: 9 (ref 5–15)
BUN: 16 mg/dL (ref 6–20)
CO2: 25 mmol/L (ref 22–32)
Calcium: 9.1 mg/dL (ref 8.9–10.3)
Chloride: 103 mmol/L (ref 98–111)
Creatinine, Ser: 0.75 mg/dL (ref 0.44–1.00)
GFR calc Af Amer: >60 mL/min (ref >60)
GFR calc non Af Amer: >60 mL/min (ref >60)
Glucose, Bld: 159 mg/dL — ABNORMAL HIGH (ref 70–99)
Potassium: 3.1 mmol/L — ABNORMAL LOW (ref 3.5–5.1)
Sodium: 137 mmol/L (ref 135–145)
Total Bilirubin: 0.3 mg/dL (ref 0.3–1.2)
Total Protein: 6.9 g/dL (ref 6.5–8.1)

## 2019-02-16 ENCOUNTER — Encounter: Payer: Self-pay | Admitting: Oncology

## 2019-02-16 ENCOUNTER — Inpatient Hospital Stay (HOSPITAL_BASED_OUTPATIENT_CLINIC_OR_DEPARTMENT_OTHER): Payer: 59 | Admitting: Oncology

## 2019-02-16 ENCOUNTER — Inpatient Hospital Stay: Payer: 59

## 2019-02-16 DIAGNOSIS — E876 Hypokalemia: Secondary | ICD-10-CM | POA: Diagnosis not present

## 2019-02-16 DIAGNOSIS — Z853 Personal history of malignant neoplasm of breast: Principal | ICD-10-CM

## 2019-02-16 DIAGNOSIS — Z170 Estrogen receptor positive status [ER+]: Secondary | ICD-10-CM | POA: Diagnosis not present

## 2019-02-16 DIAGNOSIS — Z79811 Long term (current) use of aromatase inhibitors: Secondary | ICD-10-CM | POA: Diagnosis not present

## 2019-02-16 DIAGNOSIS — C50919 Malignant neoplasm of unspecified site of unspecified female breast: Secondary | ICD-10-CM

## 2019-02-16 DIAGNOSIS — Z08 Encounter for follow-up examination after completed treatment for malignant neoplasm: Secondary | ICD-10-CM

## 2019-02-16 DIAGNOSIS — Z5181 Encounter for therapeutic drug level monitoring: Secondary | ICD-10-CM

## 2019-02-16 NOTE — Progress Notes (Signed)
I connected with Angela Glass on 02/16/19 at 11:30 AM EDT by video enabled telemedicine visit and verified that I am speaking with the correct person using two identifiers.   I discussed the limitations, risks, security and privacy concerns of performing an evaluation and management service by telemedicine and the availability of in-person appointments. I also discussed with the patient that there may be a patient responsible charge related to this service. The patient expressed understanding and agreed to proceed.  Other persons participating in the visit and their role in the encounter:  none  Patient's location:  home Provider's location:  [facility]  Diagnosis-pathological prognostic stage I A p T1b PN0CM0 grade 1 invasive lobular carcinoma ER PR positive and HER-2/neu negative status post lumpectomy   Chief Complaint: Routine follow-up of breast cancer on Arimidex  History of present illness:  patient is a 58 year old female who has been getting routine screening mammograms and her most recent mammogram in October 2019 showed a possible distortion in the left breast which was biopsied and showed invasive lobular carcinoma grade 1 strongly ER PR positive HER-2/neu negative 6 mm. Patient also had an MRI which showed non-mass enhancement of 2.8 x 1.5 x 0.8 cm in the upper outer quadrant of the left breast. No abnormal appearing lymph nodes.   Oncotype score came back at low risk and she did not require adjuvant chemotherapy.  She completed adjuvant radiation treatment and was started on Arimidex in March 2020.  Interval history she is tolerating Arimidex well so far.  She had one episode of nocturnal leg pain which has not persisted since then.  Denies any fatigue, mood swings or hot flashes   Review of Systems  Constitutional: Negative for chills, fever, malaise/fatigue and weight loss.  HENT: Negative for congestion, ear discharge and nosebleeds.   Eyes: Negative for blurred  vision.  Respiratory: Negative for cough, hemoptysis, sputum production, shortness of breath and wheezing.   Cardiovascular: Negative for chest pain, palpitations, orthopnea and claudication.  Gastrointestinal: Negative for abdominal pain, blood in stool, constipation, diarrhea, heartburn, melena, nausea and vomiting.  Genitourinary: Negative for dysuria, flank pain, frequency, hematuria and urgency.  Musculoskeletal: Negative for back pain, joint pain and myalgias.  Skin: Negative for rash.  Neurological: Negative for dizziness, tingling, focal weakness, seizures, weakness and headaches.  Endo/Heme/Allergies: Does not bruise/bleed easily.  Psychiatric/Behavioral: Negative for depression and suicidal ideas. The patient does not have insomnia.     Allergies  Allergen Reactions  . Lisinopril-Hydrochlorothiazide Cough    Past Medical History:  Diagnosis Date  . Anxiety   . Breast cancer ([place])   . Breast cancer of upper-outer quadrant of left female breast ([place]) 11/29/2017   Invasive lobular carcinoma, 2 foci 6 mm and 3 mm, T1b, N0, ER/PR positive, HER-2/neu not overexpressed.  Whole breast radiation  . Diabetes mellitus without complication ([place])   . GERD (gastroesophageal reflux disease)   . Hyperlipidemia   . Hypertension     Past Surgical History:  Procedure Laterality Date  . BREAST BIOPSY Left 08/17/2018   Affirm bx(ribbon clip), invasive lobular carcinoma  . BREAST LUMPECTOMY WITH NEEDLE LOCALIZATION Left 09/28/2018   Procedure: BREAST LUMPECTOMY WITH NEEDLE LOCALIZATION;  Surgeon: Robert Bellow, MD;  Location: [facility] ORS;  Service: General;  Laterality: Left;  . MOUTH SURGERY     DENTAL IMPLANT AND GUM SURGERY  . SENTINEL NODE BIOPSY Left 09/28/2018   Procedure: SENTINEL NODE BIOPSY;  Surgeon: Robert Bellow, MD;  Location: [facility] ORS;  Service: General;  Laterality: Left;    Social History   Socioeconomic History  . Marital status: Widowed    Spouse name: Not on  file  . Number of children: Not on file  . Years of education: Not on file  . Highest education level: Not on file  Occupational History  . Not on file  Social Needs  . Financial resource strain: Not on file  . Food insecurity:    Worry: Not on file    Inability: Not on file  . Transportation needs:    Medical: Not on file    Non-medical: Not on file  Tobacco Use  . Smoking status: Never Smoker  . Smokeless tobacco: Never Used  Substance and Sexual Activity  . Alcohol use: Yes    Comment: ocassional  . Drug use: Never  . Sexual activity: Not on file  Lifestyle  . Physical activity:    Days per week: Not on file    Minutes per session: Not on file  . Stress: Not on file  Relationships  . Social connections:    Talks on phone: Not on file    Gets together: Not on file    Attends religious service: Not on file    Active member of club or organization: Not on file    Attends meetings of clubs or organizations: Not on file    Relationship status: Not on file  . Intimate partner violence:    Fear of current or ex partner: Not on file    Emotionally abused: Not on file    Physically abused: Not on file    Forced sexual activity: Not on file  Other Topics Concern  . Not on file  Social History Narrative  . Not on file    Family History  Problem Relation Age of Onset  . Throat cancer Mother 14  . COPD Mother   . Healthy Father   . Healthy Brother   . Lung cancer Maternal Uncle   . Lung cancer Paternal Grandfather   . Heart attack Brother 55  . Lung cancer Maternal Uncle   . Breast cancer Neg Hx      Current Outpatient Medications:  .  ALPRAZolam (XANAX) 0.5 MG tablet, Take 0.5 mg by mouth daily as needed for anxiety. , Disp: , Rfl:  .  anastrozole (ARIMIDEX) 1 MG tablet, Take 1 tablet (1 mg total) by mouth daily., Disp: 30 tablet, Rfl: 2 .  Calcium Carbonate-Vitamin D (CALCIUM HIGH POTENCY/VITAMIN D) 600-200 MG-UNIT TABS, Take 1 tablet by mouth 2 (two) times a  day. , Disp: , Rfl:  .  losartan-hydrochlorothiazide (HYZAAR) 100-12.5 MG tablet, Take 1 tablet by mouth daily. , Disp: , Rfl:  .  metFORMIN (GLUCOPHAGE) 500 MG tablet, Take 500 mg by mouth daily with breakfast. , Disp: , Rfl:  .  omeprazole (PRILOSEC) 40 MG capsule, Take 40 mg by mouth daily. , Disp: , Rfl: 4 .  potassium chloride (K-DUR) 10 MEQ tablet, Take 10 mEq by mouth daily. , Disp: , Rfl:  .  rosuvastatin (CRESTOR) 10 MG tablet, Take 10 mg by mouth at bedtime. , Disp: , Rfl:  .  venlafaxine XR (EFFEXOR-XR) 37.5 MG 24 hr capsule, Take 112.5 mg by mouth daily with breakfast. , Disp: , Rfl: 3    CMP Latest Ref Rng & Units 02/15/2019  Glucose 70 - 99 mg/dL 159(H)  BUN 6 - 20 mg/dL 16  Creatinine 0.44 - 1.00 mg/dL 0.75  Sodium 135 -  145 mmol/L 137  Potassium 3.5 - 5.1 mmol/L 3.1(L)  Chloride 98 - 111 mmol/L 103  CO2 22 - 32 mmol/L 25  Calcium 8.9 - 10.3 mg/dL 9.1  Total Protein 6.5 - 8.1 g/dL 6.9  Total Bilirubin 0.3 - 1.2 mg/dL 0.3  Alkaline Phos 38 - 126 U/L 57  AST 15 - 41 U/L 18  ALT 0 - 44 U/L 16   CBC Latest Ref Rng & Units 12/10/2018  WBC 4.0 - 10.5 K/uL 8.5  Hemoglobin 12.0 - 15.0 g/dL 13.2  Hematocrit 36.0 - 46.0 % 40.9  Platelets 150 - 400 K/uL 226     Observation/objective: Patient is in no acute distress over the video visit today.  Breathing is nonlabored  Assessment and plan:Patient is a 58 y.o. female with pathological prognostic stage IA pT1b pN0 cM0 invasive lobular carcinoma grade 1 ER PR positive HER-2/neu negative status post lumpectomy.  She did not require any adjuvant chemotherapy.  She has completed adjuvant radiation treatment and is currently on Arimidex.  This is a routine follow-up visit for breast cancer  Overall patient is tolerating Arimidex well and does not report any significant side effects.  She will continue to take Arimidex along with calcium and vitamin D for 5 years.  She is scheduled for a bone density scan in June 2020.  Labs revealed  mild hypokalemia with a potassium of 3.1.  I have asked her to increase dietary intake of potassium mainly with daily bananas over the next 1 week.  I will hold off on giving her any oral potassium at this time.  Follow-up instructions: I will see her back in 3 months time with repeat CBC with differential and CMP  I discussed the assessment and treatment plan with the patient. The patient was provided an opportunity to ask questions and all were answered. The patient agreed with the plan and demonstrated an understanding of the instructions.   The patient was advised to call back or seek an in-person evaluation if the symptoms worsen or if the condition fails to improve as anticipated.    Visit Diagnosis: 1. Encounter for follow-up surveillance of breast cancer   2. Visit for monitoring Arimidex therapy     Dr. Randa Evens, MD, MPH [facility] at [facility] [phone] 02/16/2019 1:29 PM

## 2019-02-16 NOTE — Progress Notes (Signed)
Pt not having any issues from breast standpoint. She is working from home and not wearing a bra so it makes her feel better. When wearing a bra sometime the bra pulls on her neck from breasts being heavy. She has some aches just last night but she has been on meds for 6 weeks and yest. Was the first time she had aches and she did not sleep well

## 2019-03-18 ENCOUNTER — Other Ambulatory Visit: Payer: Self-pay | Admitting: Oncology

## 2019-04-05 ENCOUNTER — Ambulatory Visit
Admission: RE | Admit: 2019-04-05 | Discharge: 2019-04-05 | Disposition: A | Discharge status: Home / Self Care | Payer: 59 | Source: Ambulatory Visit | Attending: Oncology | Admitting: Oncology

## 2019-04-05 DIAGNOSIS — C50912 Malignant neoplasm of unspecified site of left female breast: Secondary | ICD-10-CM | POA: Diagnosis present

## 2019-05-17 ENCOUNTER — Other Ambulatory Visit: Payer: Self-pay

## 2019-05-17 DIAGNOSIS — C50912 Malignant neoplasm of unspecified site of left female breast: Secondary | ICD-10-CM

## 2019-05-18 ENCOUNTER — Inpatient Hospital Stay (HOSPITAL_BASED_OUTPATIENT_CLINIC_OR_DEPARTMENT_OTHER): Payer: 59 | Admitting: Oncology

## 2019-05-18 ENCOUNTER — Encounter: Payer: Self-pay | Admitting: Oncology

## 2019-05-18 ENCOUNTER — Encounter: Payer: Self-pay | Admitting: General Surgery

## 2019-05-18 ENCOUNTER — Other Ambulatory Visit: Payer: Self-pay

## 2019-05-18 ENCOUNTER — Inpatient Hospital Stay: Payer: 59 | Attending: Oncology

## 2019-05-18 VITALS — BP 111/73 | HR 69 | Temp 96.2°F | Resp 18 | Wt 217.0 lb

## 2019-05-18 DIAGNOSIS — E876 Hypokalemia: Secondary | ICD-10-CM | POA: Insufficient documentation

## 2019-05-18 DIAGNOSIS — Z08 Encounter for follow-up examination after completed treatment for malignant neoplasm: Secondary | ICD-10-CM

## 2019-05-18 DIAGNOSIS — C50912 Malignant neoplasm of unspecified site of left female breast: Secondary | ICD-10-CM

## 2019-05-18 DIAGNOSIS — Z79811 Long term (current) use of aromatase inhibitors: Secondary | ICD-10-CM

## 2019-05-18 DIAGNOSIS — C50412 Malignant neoplasm of upper-outer quadrant of left female breast: Secondary | ICD-10-CM | POA: Insufficient documentation

## 2019-05-18 DIAGNOSIS — M8580 Other specified disorders of bone density and structure, unspecified site: Secondary | ICD-10-CM

## 2019-05-18 DIAGNOSIS — Z170 Estrogen receptor positive status [ER+]: Secondary | ICD-10-CM | POA: Insufficient documentation

## 2019-05-18 DIAGNOSIS — Z5181 Encounter for therapeutic drug level monitoring: Secondary | ICD-10-CM

## 2019-05-18 LAB — CBC WITH DIFFERENTIAL/PLATELET
Abs Immature Granulocytes: 0.03 10*3/uL (ref 0.00–0.07)
Basophils Absolute: 0 10*3/uL (ref 0.0–0.1)
Basophils Relative: 0 %
Eosinophils Absolute: 0.1 10*3/uL (ref 0.0–0.5)
Eosinophils Relative: 1 %
HCT: 40.1 % (ref 36.0–46.0)
Hemoglobin: 13.4 g/dL (ref 12.0–15.0)
Immature Granulocytes: 0 %
Lymphocytes Relative: 22 %
Lymphs Abs: 1.9 10*3/uL (ref 0.7–4.0)
MCH: 28.2 pg (ref 26.0–34.0)
MCHC: 33.4 g/dL (ref 30.0–36.0)
MCV: 84.4 fL (ref 80.0–100.0)
Monocytes Absolute: 0.8 10*3/uL (ref 0.1–1.0)
Monocytes Relative: 9 %
Neutro Abs: 5.9 10*3/uL (ref 1.7–7.7)
Neutrophils Relative %: 68 %
Platelets: 255 10*3/uL (ref 150–400)
RBC: 4.75 MIL/uL (ref 3.87–5.11)
RDW: 12.6 % (ref 11.5–15.5)
WBC: 8.7 10*3/uL (ref 4.0–10.5)
nRBC: 0 % (ref 0.0–0.2)

## 2019-05-18 LAB — COMPREHENSIVE METABOLIC PANEL
ALT: 15 U/L (ref 0–44)
AST: 17 U/L (ref 15–41)
Albumin: 4 g/dL (ref 3.5–5.0)
Alkaline Phosphatase: 54 U/L (ref 38–126)
Anion gap: 11 (ref 5–15)
BUN: 15 mg/dL (ref 6–20)
CO2: 26 mmol/L (ref 22–32)
Calcium: 9.3 mg/dL (ref 8.9–10.3)
Chloride: 100 mmol/L (ref 98–111)
Creatinine, Ser: 0.83 mg/dL (ref 0.44–1.00)
GFR calc Af Amer: >60 mL/min (ref >60)
GFR calc non Af Amer: >60 mL/min (ref >60)
Glucose, Bld: 107 mg/dL — ABNORMAL HIGH (ref 70–99)
Potassium: 3.3 mmol/L — ABNORMAL LOW (ref 3.5–5.1)
Sodium: 137 mmol/L (ref 135–145)
Total Bilirubin: 0.5 mg/dL (ref 0.3–1.2)
Total Protein: 7.4 g/dL (ref 6.5–8.1)

## 2019-05-19 NOTE — Progress Notes (Signed)
Hematology/Oncology Consult note [facility]  Telephone:([phone] Fax:([phone]) [phone]  Patient Care Team: Marinda Elk, MD as PCP - General (Physician Assistant)   Name of the patient: Angela Glass  [id number]  October 22, 1961   Date of visit: 05/19/19  Diagnosis- pathological prognostic stage IA pT1b PN0CM0 grade 1 invasive lobular carcinoma ER PR positive and HER-2/neu negative status post lumpectomy   Chief complaint/ Reason for visit-routine follow-up of breast cancer clinic  Heme/Onc history: patient is a 58 year old female who has been getting routine screening mammograms and her most recent mammogram in October 2019 showed a possible distortion in the left breast which was biopsied and showed invasive lobular carcinoma grade 1 strongly ER PR positive HER-2/neu negative 6 mm. Patient also had an MRI which showed non-mass enhancement of 2.8 x 1.5 x 0.8 cm in the upper outer quadrant of the left breast. No abnormal appearing lymph nodes.  Oncotype score came back at low risk and she did not require adjuvant chemotherapy.  She completed adjuvant radiation treatment and was started on Arimidex in March 2020.  Interval history she is tolerating Arimidex well so far.  She had one episode of nocturnal leg pain which has not persisted since then.  Denies any fatigue, mood swings or hot flashes  Interval history-she is doing well Arimidex calcium and vitamin D and reports no side effects at this time.  ECOG PS- 1 Pain scale- 0   Review of systems- Review of Systems  Constitutional: Negative for chills, fever, malaise/fatigue and weight loss.  HENT: Negative for congestion, ear discharge and nosebleeds.   Eyes: Negative for blurred vision.  Respiratory: Negative for cough, hemoptysis, sputum production, shortness of breath and wheezing.   Cardiovascular: Negative for chest pain, palpitations, orthopnea and claudication.  Gastrointestinal:  Negative for abdominal pain, blood in stool, constipation, diarrhea, heartburn, melena, nausea and vomiting.  Genitourinary: Negative for dysuria, flank pain, frequency, hematuria and urgency.  Musculoskeletal: Negative for back pain, joint pain and myalgias.  Skin: Negative for rash.  Neurological: Negative for dizziness, tingling, focal weakness, seizures, weakness and headaches.  Endo/Heme/Allergies: Does not bruise/bleed easily.  Psychiatric/Behavioral: Negative for depression and suicidal ideas. The patient does not have insomnia.       Allergies  Allergen Reactions  . Lisinopril-Hydrochlorothiazide Cough     Past Medical History:  Diagnosis Date  . Anxiety   . Breast cancer ([place])   . Breast cancer of upper-outer quadrant of left female breast ([place]) 11/29/2017   Invasive lobular carcinoma, 2 foci 6 mm and 3 mm, T1b, N0, ER/PR positive, HER-2/neu not overexpressed.  Whole breast radiation  . Diabetes mellitus without complication ([place])   . GERD (gastroesophageal reflux disease)   . Hyperlipidemia   . Hypertension      Past Surgical History:  Procedure Laterality Date  . BREAST BIOPSY Left 08/17/2018   Affirm bx(ribbon clip), invasive lobular carcinoma  . BREAST LUMPECTOMY WITH NEEDLE LOCALIZATION Left 09/28/2018   Procedure: BREAST LUMPECTOMY WITH NEEDLE LOCALIZATION;  Surgeon: Robert Bellow, MD;  Location: [facility] ORS;  Service: General;  Laterality: Left;  . MOUTH SURGERY     DENTAL IMPLANT AND GUM SURGERY  . SENTINEL NODE BIOPSY Left 09/28/2018   Procedure: SENTINEL NODE BIOPSY;  Surgeon: Robert Bellow, MD;  Location: [facility] ORS;  Service: General;  Laterality: Left;    Social History   Socioeconomic History  . Marital status: Widowed    Spouse name: Not on file  . Number  of children: Not on file  . Years of education: Not on file  . Highest education level: Not on file  Occupational History  . Not on file  Social Needs  . Financial resource strain: Not  on file  . Food insecurity    Worry: Not on file    Inability: Not on file  . Transportation needs    Medical: Not on file    Non-medical: Not on file  Tobacco Use  . Smoking status: Never Smoker  . Smokeless tobacco: Never Used  Substance and Sexual Activity  . Alcohol use: Yes    Comment: ocassional  . Drug use: Never  . Sexual activity: Not on file  Lifestyle  . Physical activity    Days per week: Not on file    Minutes per session: Not on file  . Stress: Not on file  Relationships  . Social Herbalist on phone: Not on file    Gets together: Not on file    Attends religious service: Not on file    Active member of club or organization: Not on file    Attends meetings of clubs or organizations: Not on file    Relationship status: Not on file  . Intimate partner violence    Fear of current or ex partner: Not on file    Emotionally abused: Not on file    Physically abused: Not on file    Forced sexual activity: Not on file  Other Topics Concern  . Not on file  Social History Narrative  . Not on file    Family History  Problem Relation Age of Onset  . Throat cancer Mother 50  . COPD Mother   . Healthy Father   . Healthy Brother   . Lung cancer Maternal Uncle   . Lung cancer Paternal Grandfather   . Heart attack Brother 25  . Lung cancer Maternal Uncle   . Breast cancer Neg Hx      Current Outpatient Medications:  .  ALPRAZolam (XANAX) 0.5 MG tablet, Take 0.5 mg by mouth daily as needed for anxiety. , Disp: , Rfl:  .  anastrozole (ARIMIDEX) 1 MG tablet, TAKE 1 TABLET(1 MG) BY MOUTH DAILY, Disp: 30 tablet, Rfl: 2 .  Calcium Carbonate-Vitamin D (CALCIUM HIGH POTENCY/VITAMIN D) 600-200 MG-UNIT TABS, Take 1 tablet by mouth 2 (two) times a day. , Disp: , Rfl:  .  losartan-hydrochlorothiazide (HYZAAR) 100-12.5 MG tablet, Take 1 tablet by mouth daily. , Disp: , Rfl:  .  metFORMIN (GLUCOPHAGE) 500 MG tablet, Take 500 mg by mouth daily with breakfast. , Disp:  , Rfl:  .  omeprazole (PRILOSEC) 40 MG capsule, Take 40 mg by mouth daily. , Disp: , Rfl: 4 .  potassium chloride (K-DUR) 10 MEQ tablet, Take 10 mEq by mouth daily. , Disp: , Rfl:  .  rosuvastatin (CRESTOR) 10 MG tablet, Take 10 mg by mouth at bedtime. , Disp: , Rfl:  .  venlafaxine XR (EFFEXOR-XR) 37.5 MG 24 hr capsule, Take 112.5 mg by mouth daily with breakfast. , Disp: , Rfl: 3  Physical exam:  Vitals:   05/18/19 1146  BP: 111/73  Pulse: 69  Resp: 18  Temp: (!) 96.2 F (35.7 C)  TempSrc: Tympanic  Weight: 217 lb (98.4 kg)   Physical Exam Constitutional:      General: She is not in acute distress. HENT:     Head: Normocephalic and atraumatic.  Eyes:     Pupils:  Pupils are equal, round, and reactive to light.  Neck:     Musculoskeletal: Normal range of motion.  Cardiovascular:     Rate and Rhythm: Normal rate and regular rhythm.     Heart sounds: Normal heart sounds.  Pulmonary:     Effort: Pulmonary effort is normal.     Breath sounds: Normal breath sounds.  Abdominal:     General: Bowel sounds are normal.     Palpations: Abdomen is soft.  Skin:    General: Skin is warm and dry.  Neurological:     Mental Status: She is alert and oriented to person, place, and time.     Breast exam was performed in seated and lying down position. Patient is status post left lumpectomy with a well-healed surgical scar. No evidence of any palpable masses. No evidence of axillary adenopathy. No evidence of any palpable masses or lumps in the right breast. No evidence of right axillary adenopathy  CMP Latest Ref Rng & Units 05/18/2019  Glucose 70 - 99 mg/dL 107(H)  BUN 6 - 20 mg/dL 15  Creatinine 0.44 - 1.00 mg/dL 0.83  Sodium 135 - 145 mmol/L 137  Potassium 3.5 - 5.1 mmol/L 3.3(L)  Chloride 98 - 111 mmol/L 100  CO2 22 - 32 mmol/L 26  Calcium 8.9 - 10.3 mg/dL 9.3  Total Protein 6.5 - 8.1 g/dL 7.4  Total Bilirubin 0.3 - 1.2 mg/dL 0.5  Alkaline Phos 38 - 126 U/L 54  AST 15 - 41 U/L  17  ALT 0 - 44 U/L 15   CBC Latest Ref Rng & Units 05/18/2019  WBC 4.0 - 10.5 K/uL 8.7  Hemoglobin 12.0 - 15.0 g/dL 13.4  Hematocrit 36.0 - 46.0 % 40.1  Platelets 150 - 400 K/uL 255      Assessment and plan- Patient is a 58 y.o. female with history of stage Ia ER PR positive HER-2/neu negative invasive lobular carcinoma of the left breast status post lumpectomy and adjuvant radiation treatment.  She is currently on Arimidex and this is a routine follow-up visit  Clinically patient is doing well and there are no concerning signs and symptoms of recurrence based on today's exam.  She is tolerating Arimidex well without any significant side effects.  Her recent bone density scan from June 2020 did reveal osteopenia but it was not significant enough to warrant starting bisphosphonates at this time.  Continue to monitor  Patient does have chronic mild hypokalemia and her encouraged her to speak to Dr. Carrie Mew about this.   Visit Diagnosis 1. Encounter for follow-up surveillance of breast cancer   2. Visit for monitoring Arimidex therapy      Dr. Randa Evens, MD, MPH [facility] at [facility] [phone] 05/19/2019 8:29 AM

## 2019-06-22 ENCOUNTER — Other Ambulatory Visit: Payer: Self-pay

## 2019-06-22 DIAGNOSIS — C50912 Malignant neoplasm of unspecified site of left female breast: Secondary | ICD-10-CM

## 2019-06-25 ENCOUNTER — Other Ambulatory Visit: Payer: Self-pay

## 2019-06-25 ENCOUNTER — Other Ambulatory Visit: Payer: Self-pay | Admitting: Oncology

## 2019-06-28 ENCOUNTER — Ambulatory Visit
Admission: RE | Admit: 2019-06-28 | Discharge: 2019-06-28 | Disposition: A | Discharge status: Home / Self Care | Payer: 59 | Source: Ambulatory Visit | Attending: Radiation Oncology | Admitting: Radiation Oncology

## 2019-06-28 ENCOUNTER — Other Ambulatory Visit: Payer: Self-pay

## 2019-06-28 ENCOUNTER — Encounter: Payer: Self-pay | Admitting: Radiation Oncology

## 2019-06-28 ENCOUNTER — Ambulatory Visit: Payer: 59 | Admitting: Radiation Oncology

## 2019-06-28 VITALS — BP 134/64 | HR 56 | Temp 98.6°F | Resp 18 | Wt 217.3 lb

## 2019-06-28 DIAGNOSIS — Z170 Estrogen receptor positive status [ER+]: Secondary | ICD-10-CM | POA: Insufficient documentation

## 2019-06-28 DIAGNOSIS — Z79811 Long term (current) use of aromatase inhibitors: Secondary | ICD-10-CM | POA: Insufficient documentation

## 2019-06-28 DIAGNOSIS — C50912 Malignant neoplasm of unspecified site of left female breast: Secondary | ICD-10-CM

## 2019-06-28 DIAGNOSIS — C50412 Malignant neoplasm of upper-outer quadrant of left female breast: Secondary | ICD-10-CM | POA: Diagnosis not present

## 2019-06-28 DIAGNOSIS — Z923 Personal history of irradiation: Secondary | ICD-10-CM | POA: Diagnosis not present

## 2019-06-28 NOTE — Progress Notes (Signed)
Radiation Oncology Follow up Note  Name: Angela Glass   Date:   06/28/2019 MRN:  [id number] DOB: [birth year]    This 58 y.o. female presents to the clinic today for 72-month follow-up status post hypofractionated radiation therapy to her left breast for stage I ER PR positive invasive lobular carcinoma.  REFERRING PROVIDER: Marinda Elk, MD  HPI: Patient is a 58 year old female now about 6 months having completed whole breast radiation hypofractionated course of treatment for stage I ER PR positive invasive lobular carcinoma.  Seen today in routine follow-up she is doing well.  She specifically denies breast tenderness cough or bone pain.  Left breast is still slightly erythematous and thicker than the right based on the large size of her breast volume..  She is currently in arimadex tolerated it well without side effect.  She has follow-up mammogram scheduled for October.  COMPLICATIONS OF TREATMENT: none  FOLLOW UP COMPLIANCE: keeps appointments   PHYSICAL EXAM:  BP 134/64 (BP Location: Left Arm)   Pulse (!) 56   Temp 98.6 F (37 C) (Tympanic)   Resp 18   Wt 217 lb 4.8 oz (98.6 kg)   BMI 42.44 kg/m  Breasts are asymmetrical with the left breast being more solid and retracted in size secondary to radiation.  No dominant mass or nodularity is noted in either breast in 2 positions examined.  No axillary or supraclavicular adenopathy is appreciated.  Well-developed well-nourished patient in NAD. HEENT reveals PERLA, EOMI, discs not visualized.  Oral cavity is clear. No oral mucosal lesions are identified. Neck is clear without evidence of cervical or supraclavicular adenopathy. Lungs are clear to A&P. Cardiac examination is essentially unremarkable with regular rate and rhythm without murmur rub or thrill. Abdomen is benign with no organomegaly or masses noted. Motor sensory and DTR levels are equal and symmetric in the upper and lower extremities. Cranial nerves II through  XII are grossly intact. Proprioception is intact. No peripheral adenopathy or edema is identified. No motor or sensory levels are noted. Crude visual fields are within normal range.  RADIOLOGY RESULTS: No current films to review  PLAN: Present time patient is doing well 6 months out from whole breast radiation with no significant side effects or evidence of disease.  I am pleased with her overall progress.  I have asked to see her back in 6 months.  She continues on arimadex.  She is scheduled for mammograms in October.  Patient knows to call with any concerns.  I would like to take this opportunity to thank you for allowing me to participate in the care of your patient.[name], MD

## 2019-08-03 ENCOUNTER — Ambulatory Visit
Admission: RE | Admit: 2019-08-03 | Discharge: 2019-08-03 | Disposition: A | Discharge status: Home / Self Care | Payer: 59 | Source: Ambulatory Visit | Attending: Surgery | Admitting: Surgery

## 2019-08-03 DIAGNOSIS — C50912 Malignant neoplasm of unspecified site of left female breast: Secondary | ICD-10-CM

## 2019-08-03 HISTORY — DX: Personal history of irradiation: Z92.3

## 2019-08-09 ENCOUNTER — Ambulatory Visit: Payer: 59 | Admitting: Surgery

## 2019-08-11 ENCOUNTER — Ambulatory Visit: Payer: 59 | Admitting: Surgery

## 2019-08-11 ENCOUNTER — Other Ambulatory Visit: Payer: Self-pay

## 2019-08-11 ENCOUNTER — Encounter: Payer: Self-pay | Admitting: Surgery

## 2019-08-11 VITALS — BP 125/80 | HR 76 | Temp 97.7°F | Ht 60.0 in | Wt 216.0 lb

## 2019-08-11 DIAGNOSIS — C50912 Malignant neoplasm of unspecified site of left female breast: Secondary | ICD-10-CM | POA: Diagnosis not present

## 2019-08-11 NOTE — Patient Instructions (Addendum)
The patient has been asked to return to the office in one year with a bilateral diagnostic mammogram.Continue self breast exams. Call office for any new breast issues or concerns.  

## 2019-08-16 ENCOUNTER — Encounter: Payer: Self-pay | Admitting: Surgery

## 2019-08-16 NOTE — Progress Notes (Signed)
Outpatient Surgical Follow Up  08/16/2019  Angela Glass is an 58 y.o. female.   Chief Complaint  Patient presents with  . Follow-up    mammogram    HPI: Angela Glass is a 58 year old female with a history of invasive lobular carcinoma of the left breast status post lumpectomy and radiation therapy in 09/2018 by Dr. Bary Castilla. She comes for a physical exam and review of her mammogram.  She reports some chronic skin changes after the radiation and some edema within the left breast.  No new masses.  No fevers no chills.  Some chronic discomfort in the left breast that occur after radiation therapy. I have personally reviewed the mammogram showing no evidence of new suspicious lesions.  Past Medical History:  Diagnosis Date  . Anxiety   . Breast cancer ([place]) 07/2018   Invasive lobular carcinoma  . Breast cancer of upper-outer quadrant of left female breast ([place]) 09/28/2018   Invasive lobular carcinoma, 2 foci 6 mm and 3 mm, T1b, N0, ER/PR positive, HER-2/neu not overexpressed.  Whole breast radiation  . Diabetes mellitus without complication ([place])   . GERD (gastroesophageal reflux disease)   . Hyperlipidemia   . Hypertension   . Personal history of radiation therapy    Left breast    Past Surgical History:  Procedure Laterality Date  . BREAST BIOPSY Left 08/17/2018   Affirm bx(ribbon clip), invasive lobular carcinoma  . BREAST LUMPECTOMY Left 09/28/2018   ILC  . BREAST LUMPECTOMY WITH NEEDLE LOCALIZATION Left 09/28/2018   Procedure: BREAST LUMPECTOMY WITH NEEDLE LOCALIZATION;  Surgeon: Robert Bellow, MD;  Location: [facility] ORS;  Service: General;  Laterality: Left;  . MOUTH SURGERY     DENTAL IMPLANT AND GUM SURGERY  . SENTINEL NODE BIOPSY Left 09/28/2018   Procedure: SENTINEL NODE BIOPSY;  Surgeon: Robert Bellow, MD;  Location: [facility] ORS;  Service: General;  Laterality: Left;    Family History  Problem Relation Age of Onset  . Throat cancer Mother 82  . COPD  Mother   . Healthy Father   . Healthy Brother   . Lung cancer Maternal Uncle   . Lung cancer Paternal Grandfather   . Heart attack Brother 85  . Lung cancer Maternal Uncle   . Breast cancer Neg Hx     Social History:  reports that she has never smoked. She has never used smokeless tobacco. She reports current alcohol use. She reports that she does not use drugs.  Allergies:  Allergies  Allergen Reactions  . Lisinopril-Hydrochlorothiazide Cough    Medications reviewed.    ROS Full ROS performed and is otherwise negative other than what is stated in HPI   BP 125/80   Pulse 76   Temp 97.7 F (36.5 C)   Ht 5' (1.524 m)   Wt 216 lb (98 kg)   SpO2 97%   BMI 42.18 kg/m   Physical Exam Vitals signs and nursing note reviewed. Exam conducted with a chaperone present.  Constitutional:      General: She is not in acute distress.    Appearance: Normal appearance. She is normal weight. She is not ill-appearing.  Eyes:     General: No scleral icterus.       Right eye: No discharge.        Left eye: No discharge.  Neck:     Musculoskeletal: Normal range of motion and neck supple. No neck rigidity.  Cardiovascular:     Rate and Rhythm: Normal rate and regular rhythm.  Heart sounds: No murmur.  Pulmonary:     Effort: Pulmonary effort is normal. No respiratory distress.     Breath sounds: Normal breath sounds.     Comments: There is evidence of left breast edema with some induration in the left breast likely from radiation changes.  There is a well-healed lumpectomy scar.  There are some thickening to the left breast as expected from radiation therapy.  There are no areas of new lesions.  There is no evidence of new lymphadenopathy Abdominal:     General: Abdomen is flat. There is no distension.     Tenderness: There is no guarding.  Lymphadenopathy:     Cervical: No cervical adenopathy.  Skin:    General: Skin is warm and dry.     Capillary Refill: Capillary refill takes  less than 2 seconds.     Coloration: Skin is not jaundiced.  Neurological:     General: No focal deficit present.     Mental Status: She is alert and oriented to person, place, and time.  Psychiatric:        Mood and Affect: Mood normal.        Behavior: Behavior normal.        Thought Content: Thought content normal.        Judgment: Judgment normal.    Assessment/Plan: 58 year old female status post lumpectomy and radiation now with chronic changes consistent with radiation sequela.  No new lesions that would merit any surgical intervention or biopsies at this time.  We will see her back with a mammogram in 1 year but we may see her clinically in about 6 months to make sure she does not develop any local issues to the left breast.   Greater than 50% of the 25 minutes  visit was spent in counseling/coordination of care   Caroleen Hamman, MD [facility]

## 2019-09-20 ENCOUNTER — Other Ambulatory Visit: Payer: Self-pay

## 2019-09-20 NOTE — Progress Notes (Signed)
Patient pre screened for office appointment, no questions or concerns today. Patient reminded of upcoming appointment time and date. 

## 2019-09-21 ENCOUNTER — Other Ambulatory Visit: Payer: Self-pay

## 2019-09-21 ENCOUNTER — Inpatient Hospital Stay: Payer: 59 | Attending: Oncology | Admitting: Oncology

## 2019-09-21 VITALS — BP 125/68 | HR 83 | Temp 98.3°F | Wt 215.0 lb

## 2019-09-21 DIAGNOSIS — Z5181 Encounter for therapeutic drug level monitoring: Secondary | ICD-10-CM | POA: Diagnosis not present

## 2019-09-21 DIAGNOSIS — C50412 Malignant neoplasm of upper-outer quadrant of left female breast: Secondary | ICD-10-CM | POA: Insufficient documentation

## 2019-09-21 DIAGNOSIS — Z853 Personal history of malignant neoplasm of breast: Secondary | ICD-10-CM | POA: Diagnosis not present

## 2019-09-21 DIAGNOSIS — Z170 Estrogen receptor positive status [ER+]: Secondary | ICD-10-CM | POA: Insufficient documentation

## 2019-09-21 DIAGNOSIS — Z08 Encounter for follow-up examination after completed treatment for malignant neoplasm: Secondary | ICD-10-CM | POA: Diagnosis not present

## 2019-09-21 DIAGNOSIS — Z79811 Long term (current) use of aromatase inhibitors: Secondary | ICD-10-CM | POA: Diagnosis not present

## 2019-09-23 ENCOUNTER — Other Ambulatory Visit: Payer: Self-pay | Admitting: Oncology

## 2019-09-27 ENCOUNTER — Encounter: Payer: Self-pay | Admitting: Oncology

## 2019-09-27 NOTE — Progress Notes (Signed)
Hematology/Oncology Consult note [facility]  Telephone:([phone] [phone]  Patient Care Team: Marinda Elk, MD as PCP - General (Physician Assistant)   Name of the patient: Angela Glass  [id number]  Sep 15, 1961   Date of visit: 09/27/19  Diagnosis- pathological prognostic stage IA pT1b [id number] grade 1 invasive lobular carcinoma ER PR positive and HER-2/neu negative status post lumpectomy   Chief complaint/ Reason for visit-routine follow-up of breast cancer  Heme/Onc history: patient is a 58 year old female who has been getting routine screening mammograms and her most recent mammogram in October 2019 showed a possible distortion in the left breast which was biopsied and showed invasive lobular carcinoma grade 1 strongly ER PR positive HER-2/neu negative 6 mm. Patient also had an MRI which showed non-mass enhancement of 2.8 x 1.5 x 0.8 cm in the upper outer quadrant of the left breast. No abnormal appearing lymph nodes.Oncotype score came back at low risk and she did not require adjuvant chemotherapy. She completed adjuvant radiation treatment and was started on Arimidex in March 2020.   Interval history-she is doing well on Arimidex and denies any significant mood swings or hot flashes.  She is also taking calcium and vitamin D daily.  ECOG PS- 1 Pain scale- 0  Review of systems- Review of Systems  Constitutional: Negative for chills, fever, malaise/fatigue and weight loss.  HENT: Negative for congestion, ear discharge and nosebleeds.   Eyes: Negative for blurred vision.  Respiratory: Negative for cough, hemoptysis, sputum production, shortness of breath and wheezing.   Cardiovascular: Negative for chest pain, palpitations, orthopnea and claudication.  Gastrointestinal: Negative for abdominal pain, blood in stool, constipation, diarrhea, heartburn, melena, nausea and vomiting.  Genitourinary: Negative for dysuria, flank  pain, frequency, hematuria and urgency.  Musculoskeletal: Negative for back pain, joint pain and myalgias.  Skin: Negative for rash.  Neurological: Negative for dizziness, tingling, focal weakness, seizures, weakness and headaches.  Endo/Heme/Allergies: Does not bruise/bleed easily.  Psychiatric/Behavioral: Negative for depression and suicidal ideas. The patient does not have insomnia.       Allergies  Allergen Reactions  . Lisinopril-Hydrochlorothiazide Cough     Past Medical History:  Diagnosis Date  . Anxiety   . Breast cancer ([place]) 07/2018   Invasive lobular carcinoma  . Breast cancer of upper-outer quadrant of left female breast ([place]) 09/28/2018   Invasive lobular carcinoma, 2 foci 6 mm and 3 mm, T1b, N0, ER/PR positive, HER-2/neu not overexpressed.  Whole breast radiation  . Diabetes mellitus without complication ([place])   . GERD (gastroesophageal reflux disease)   . Hyperlipidemia   . Hypertension   . Personal history of radiation therapy    Left breast     Past Surgical History:  Procedure Laterality Date  . BREAST BIOPSY Left 08/17/2018   Affirm bx(ribbon clip), invasive lobular carcinoma  . BREAST LUMPECTOMY Left 09/28/2018   ILC  . BREAST LUMPECTOMY WITH NEEDLE LOCALIZATION Left 09/28/2018   Procedure: BREAST LUMPECTOMY WITH NEEDLE LOCALIZATION;  Surgeon: Robert Bellow, MD;  Location: [facility] ORS;  Service: General;  Laterality: Left;  . MOUTH SURGERY     DENTAL IMPLANT AND GUM SURGERY  . SENTINEL NODE BIOPSY Left 09/28/2018   Procedure: SENTINEL NODE BIOPSY;  Surgeon: Robert Bellow, MD;  Location: [facility] ORS;  Service: General;  Laterality: Left;    Social History   Socioeconomic History  . Marital status: Widowed    Spouse name: Not on file  . Number of children: Not on  file  . Years of education: Not on file  . Highest education level: Not on file  Occupational History  . Not on file  Social Needs  . Financial resource strain: Not on file  .  Food insecurity    Worry: Not on file    Inability: Not on file  . Transportation needs    Medical: Not on file    Non-medical: Not on file  Tobacco Use  . Smoking status: Never Smoker  . Smokeless tobacco: Never Used  Substance and Sexual Activity  . Alcohol use: Yes    Comment: ocassional  . Drug use: Never  . Sexual activity: Not on file  Lifestyle  . Physical activity    Days per week: Not on file    Minutes per session: Not on file  . Stress: Not on file  Relationships  . Social Herbalist on phone: Not on file    Gets together: Not on file    Attends religious service: Not on file    Active member of club or organization: Not on file    Attends meetings of clubs or organizations: Not on file    Relationship status: Not on file  . Intimate partner violence    Fear of current or ex partner: Not on file    Emotionally abused: Not on file    Physically abused: Not on file    Forced sexual activity: Not on file  Other Topics Concern  . Not on file  Social History Narrative  . Not on file    Family History  Problem Relation Age of Onset  . Throat cancer Mother 26  . COPD Mother   . Healthy Father   . Healthy Brother   . Lung cancer Maternal Uncle   . Lung cancer Paternal Grandfather   . Heart attack Brother 53  . Lung cancer Maternal Uncle   . Breast cancer Neg Hx      Current Outpatient Medications:  .  ALPRAZolam (XANAX) 0.5 MG tablet, Take 0.5 mg by mouth daily as needed for anxiety. , Disp: , Rfl:  .  Calcium Carbonate-Vitamin D (CALCIUM HIGH POTENCY/VITAMIN D) 600-200 MG-UNIT TABS, Take 1 tablet by mouth 2 (two) times a day. , Disp: , Rfl:  .  losartan-hydrochlorothiazide (HYZAAR) 100-12.5 MG tablet, Take 1 tablet by mouth daily. , Disp: , Rfl:  .  metFORMIN (GLUCOPHAGE) 500 MG tablet, Take 500 mg by mouth daily with breakfast. , Disp: , Rfl:  .  omeprazole (PRILOSEC) 40 MG capsule, Take 40 mg by mouth daily. , Disp: , Rfl: 4 .  potassium  chloride (K-DUR) 10 MEQ tablet, Take 10 mEq by mouth daily. , Disp: , Rfl:  .  rosuvastatin (CRESTOR) 10 MG tablet, Take 10 mg by mouth at bedtime. , Disp: , Rfl:  .  venlafaxine XR (EFFEXOR-XR) 37.5 MG 24 hr capsule, Take 112.5 mg by mouth daily with breakfast. , Disp: , Rfl: 3 .  anastrozole (ARIMIDEX) 1 MG tablet, TAKE 1 TABLET BY MOUTH EVERY DAY, Disp: 30 tablet, Rfl: 2  Physical exam:  Vitals:   09/21/19 1122  BP: 125/68  Pulse: 83  Temp: 98.3 F (36.8 C)  TempSrc: Tympanic  Weight: 215 lb (97.5 kg)   Physical Exam Constitutional:      General: She is not in acute distress. HENT:     Head: Normocephalic and atraumatic.  Eyes:     Pupils: Pupils are equal, round, and reactive to light.  Neck:     Musculoskeletal: Normal range of motion.  Cardiovascular:     Rate and Rhythm: Normal rate and regular rhythm.     Heart sounds: Normal heart sounds.  Pulmonary:     Effort: Pulmonary effort is normal.     Breath sounds: Normal breath sounds.  Abdominal:     General: Bowel sounds are normal.     Palpations: Abdomen is soft.  Skin:    General: Skin is warm and dry.  Neurological:     Mental Status: She is alert and oriented to person, place, and time.      CMP Latest Ref Rng & Units 05/18/2019  Glucose 70 - 99 mg/dL 107(H)  BUN 6 - 20 mg/dL 15  Creatinine 0.44 - 1.00 mg/dL 0.83  Sodium 135 - 145 mmol/L 137  Potassium 3.5 - 5.1 mmol/L 3.3(L)  Chloride 98 - 111 mmol/L 100  CO2 22 - 32 mmol/L 26  Calcium 8.9 - 10.3 mg/dL 9.3  Total Protein 6.5 - 8.1 g/dL 7.4  Total Bilirubin 0.3 - 1.2 mg/dL 0.5  Alkaline Phos 38 - 126 U/L 54  AST 15 - 41 U/L 17  ALT 0 - 44 U/L 15   CBC Latest Ref Rng & Units 05/18/2019  WBC 4.0 - 10.5 K/uL 8.7  Hemoglobin 12.0 - 15.0 g/dL 13.4  Hematocrit 36.0 - 46.0 % 40.1  Platelets 150 - 400 K/uL 255    No images are attached to the encounter.  No results found.   Assessment and plan- Patient is a 58 y.o. female with history of stage Ia ER  PR positive HER-2/neu negative invasive lobular carcinoma of the left breast status post lumpectomy and adjuvant radiation treatment.    This is a routine follow-up visit of breast cancer on Arimidex  Clinically patient is doing well and no concerning symptoms of recurrence based on today's visit.  Also her recent mammogram from last month did not show any evidence of malignancy.  She is tolerating Arimidex calcium and vitamin D well without any significant side effects.  I will see her back in 6 months no labs   Visit Diagnosis 1. Encounter for follow-up surveillance of breast cancer   2. Visit for monitoring Arimidex therapy      Dr. Randa Evens, MD, MPH [facility] at [facility] [phone] 09/27/2019 12:21 PM

## 2019-11-17 ENCOUNTER — Encounter: Payer: Self-pay | Admitting: *Deleted

## 2019-12-22 ENCOUNTER — Other Ambulatory Visit: Payer: Self-pay | Admitting: Oncology

## 2019-12-24 IMAGING — MG DIGITAL SCREENING BILATERAL MAMMOGRAM WITH TOMO AND CAD
8 of 15 series · 8 of 40 positions shown · non-contrast
Comparison: Previous exam(s).

CLINICAL DATA: Screening.

EXAM:
DIGITAL SCREENING BILATERAL MAMMOGRAM WITH TOMO AND CAD

[L CC synth-2D (1 of 2)]
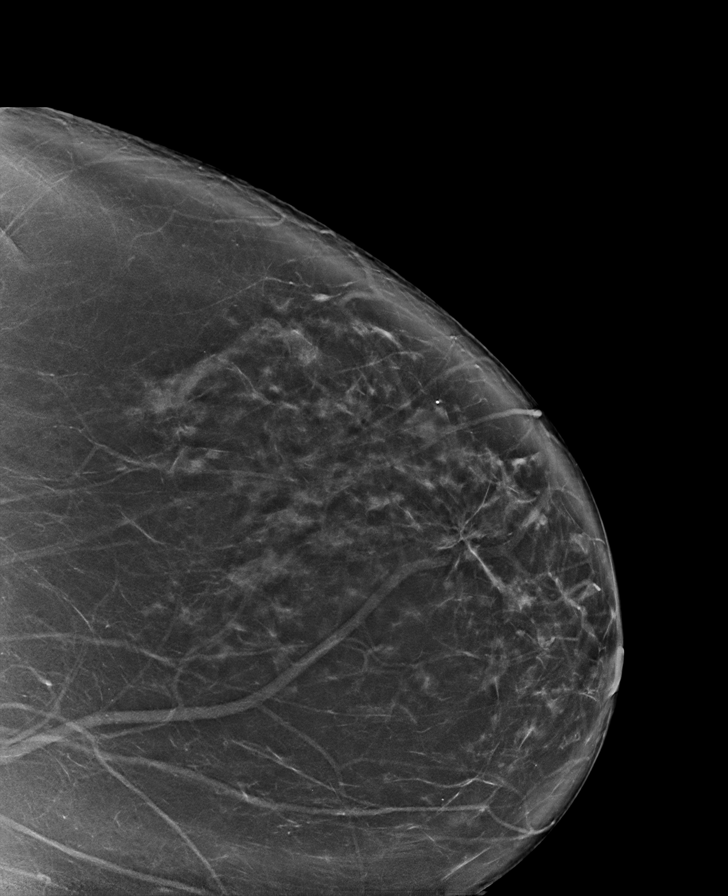

[L MLO synth-2D]
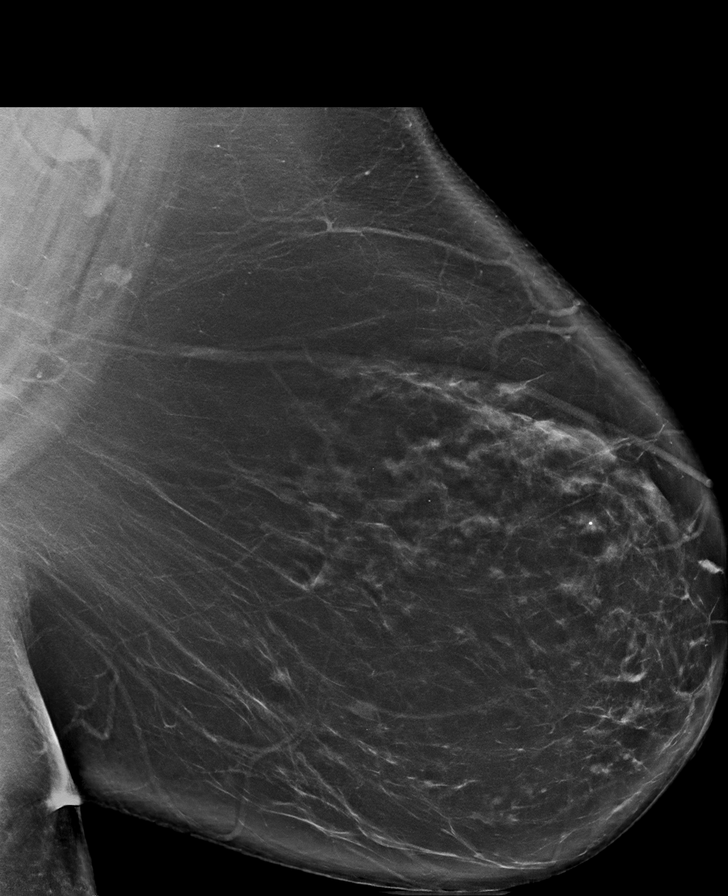

[R MLO synth-2D (1 of 2)]
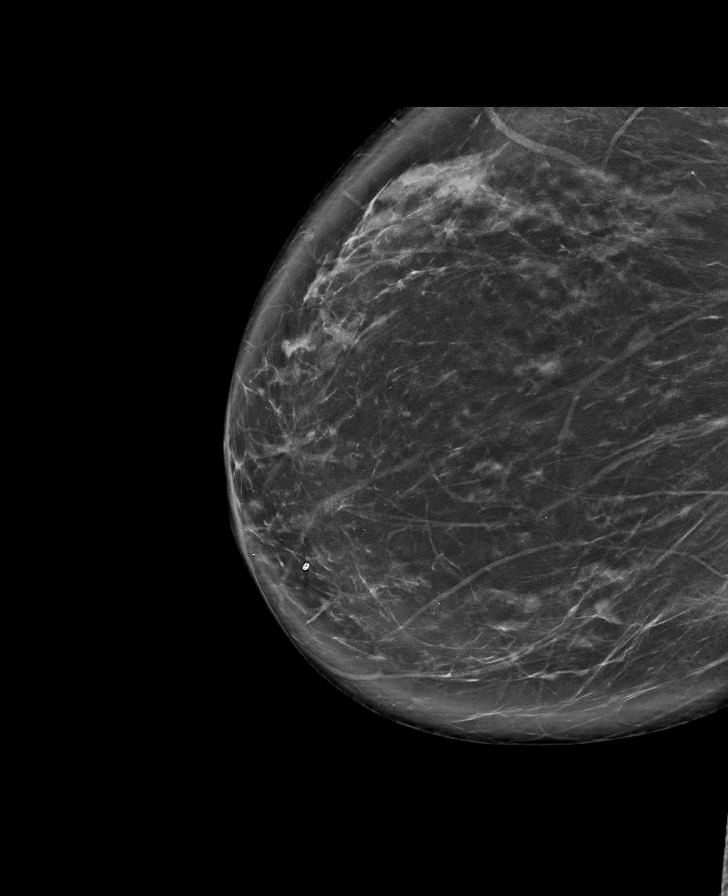

[R CC synth-2D (1 of 2)]
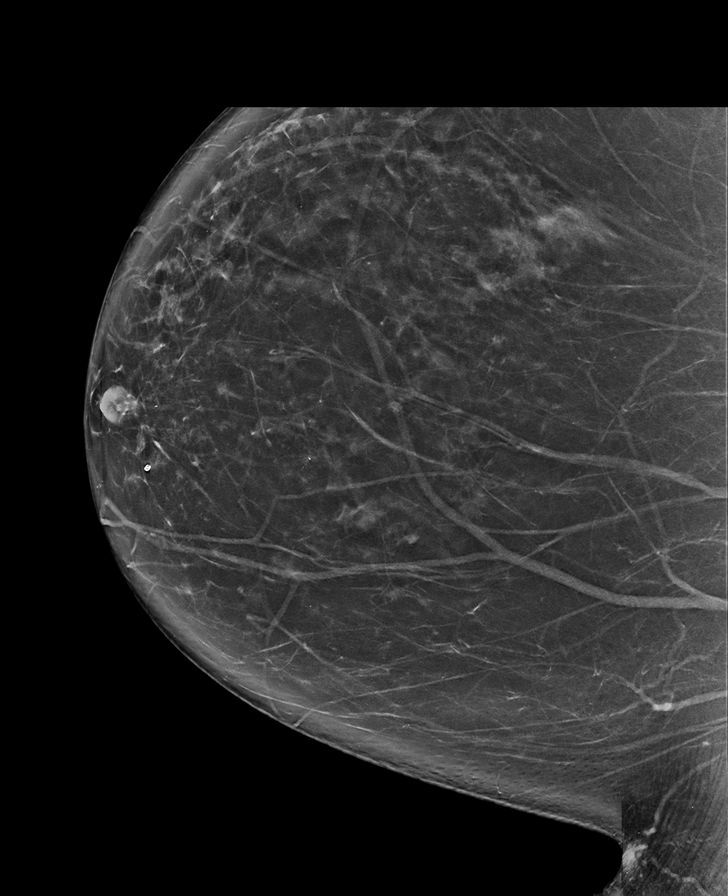

[R CC synth-2D (2 of 2)]
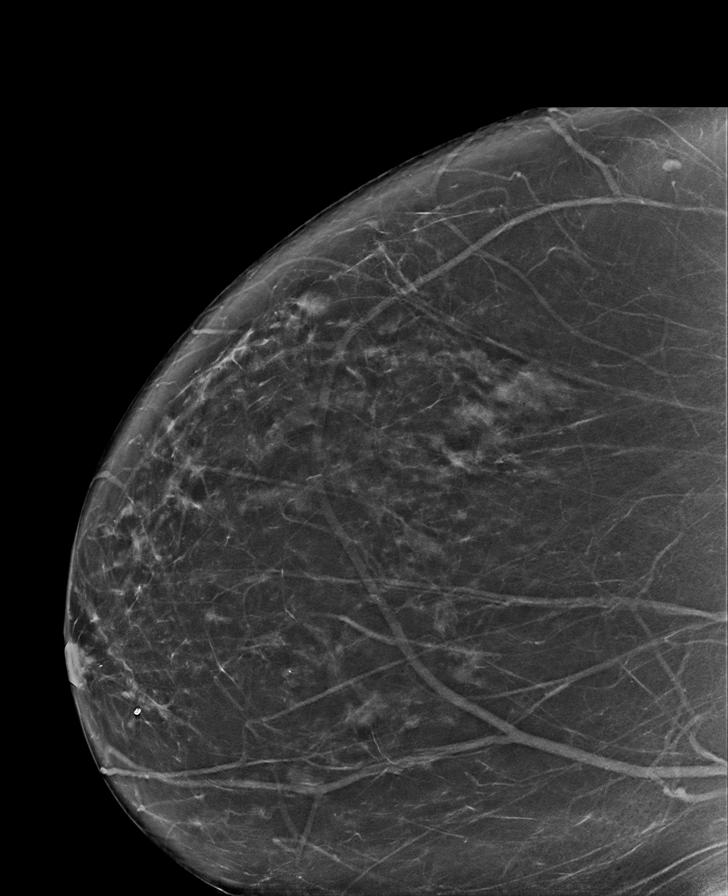

[R MLO synth-2D (2 of 2)]
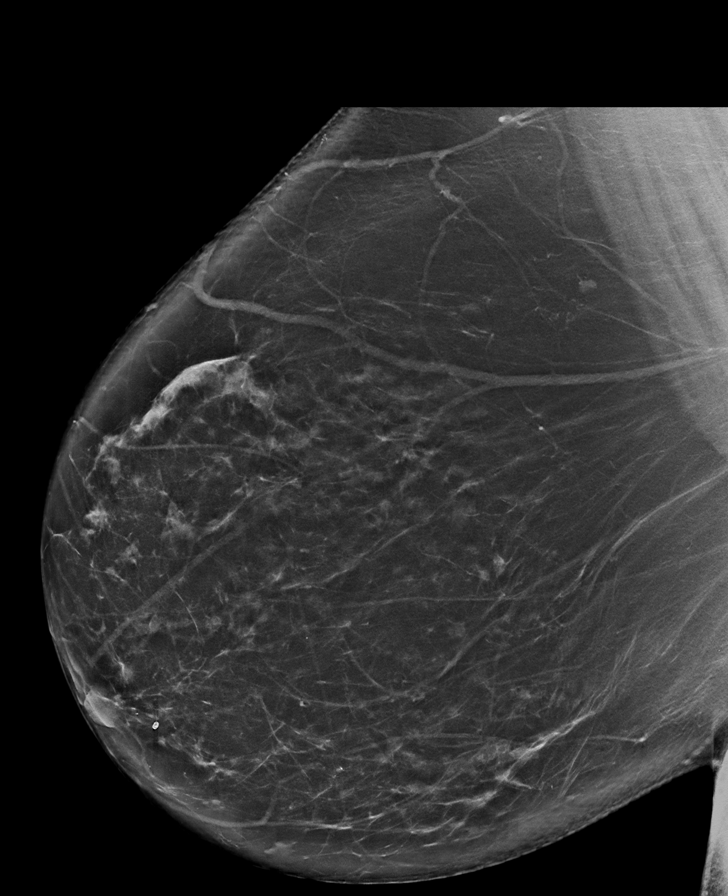

[L CC synth-2D (2 of 2)]
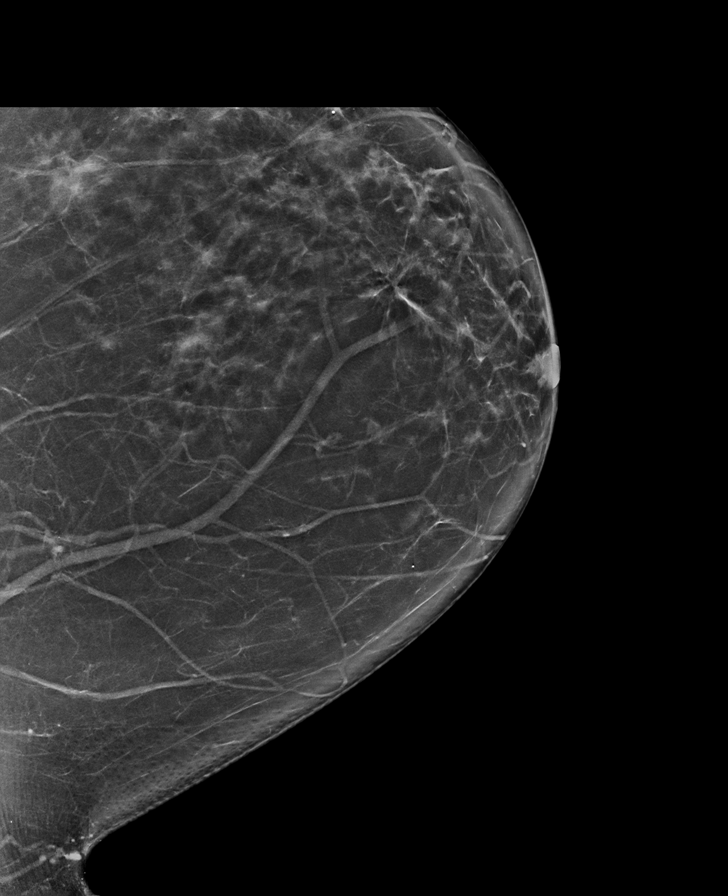

[L CC tomo · tomo slice 66/96.0]
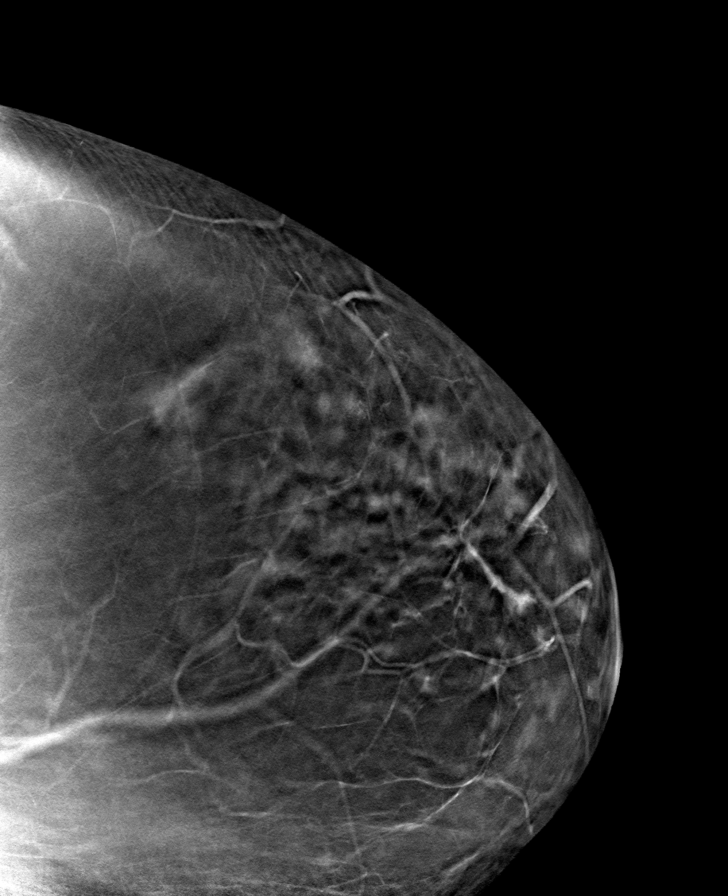

[8 of 40 positions shown; findings below may reference images not displayed]

ACR Breast Density Category c: The breast tissue is heterogeneously
dense, which may obscure small masses.
FINDINGS: In the left breast, possible distortion warrants further evaluation.
In the right breast, no findings suspicious for malignancy. Images
were processed with CAD.
IMPRESSION: Further evaluation is suggested for possible distortion in the left
breast.

RECOMMENDATION:
Diagnostic mammogram and possibly ultrasound of the left breast.
(Code:J9-G-HHO)

The patient will be contacted regarding the findings, and additional
imaging will be scheduled.

BI-RADS CATEGORY  0: Incomplete. Need additional imaging evaluation
and/or prior mammograms for comparison.

## 2019-12-27 ENCOUNTER — Ambulatory Visit: Payer: 59 | Admitting: Radiation Oncology

## 2020-01-04 ENCOUNTER — Other Ambulatory Visit: Payer: Self-pay

## 2020-01-05 ENCOUNTER — Other Ambulatory Visit: Payer: Self-pay

## 2020-01-05 ENCOUNTER — Ambulatory Visit
Admission: RE | Admit: 2020-01-05 | Discharge: 2020-01-05 | Disposition: A | Discharge status: Home / Self Care | Payer: Self-pay | Source: Ambulatory Visit | Attending: Radiation Oncology | Admitting: Radiation Oncology

## 2020-01-05 VITALS — BP 150/86 | HR 78 | Temp 98.6°F | Resp 16 | Wt 218.7 lb

## 2020-01-05 DIAGNOSIS — C50912 Malignant neoplasm of unspecified site of left female breast: Secondary | ICD-10-CM

## 2020-01-05 NOTE — Progress Notes (Signed)
Radiation Oncology Follow up Note  Name: Angela Glass   Date:   01/05/2020 MRN:  [id number] DOB: 1960-11-11    This 59 y.o. female presents to the clinic today for 1 year follow-up status post hypofractionated radiation therapy to her left breast for stage I ER/PR positive invasive lobular carcinoma.  REFERRING PROVIDER: Marinda Elk, MD  HPI: Patient is a 59 year old female now out 1 year having completed hypofractionated radiation therapy to her left breast for stage I ER/PR positive invasive lobular carcinoma seen today in routine follow-up she is doing well.  Her left breast remains somewhat lymphedematous shrunken although cosmetic result is still good.  This does not disturb her bother the patient.  She has extremely large pendulous breasts on the right side.  She is currently on Arimidex tolerating it well without side effect..  She had mammograms back in October which was BI-RADS 2 benign which I have reviewed.  COMPLICATIONS OF TREATMENT: none  FOLLOW UP COMPLIANCE: keeps appointments   PHYSICAL EXAM:  BP (!) 150/86   Pulse 78   Temp 98.6 F (37 C)   Resp 16   Wt 218 lb 11.2 oz (99.2 kg)   SpO2 96%   BMI 42.71 kg/m  Left breast is reduced in size somewhat lymphedematous and firm.  No dominant mass or nodularity is noted in either breast in 2 positions examined.  No axillary or supraclavicular adenopathy is identified.  Well-developed well-nourished patient in NAD. HEENT reveals PERLA, EOMI, discs not visualized.  Oral cavity is clear. No oral mucosal lesions are identified. Neck is clear without evidence of cervical or supraclavicular adenopathy. Lungs are clear to A&P. Cardiac examination is essentially unremarkable with regular rate and rhythm without murmur rub or thrill. Abdomen is benign with no organomegaly or masses noted. Motor sensory and DTR levels are equal and symmetric in the upper and lower extremities. Cranial nerves II through XII are grossly  intact. Proprioception is intact. No peripheral adenopathy or edema is identified. No motor or sensory levels are noted. Crude visual fields are within normal range.  RADIOLOGY RESULTS: Mammograms reviewed compatible with above-stated findings  PLAN: Present time patient is doing well I have asked to see her back in 1 year for follow-up.  She continues on Arimidex without side effect.  She is already scheduled for follow-up mammograms.  Patient knows to call with any concerns at any time.  I would like to take this opportunity to thank you for allowing me to participate in the care of your patient.[name], MD

## 2020-03-20 ENCOUNTER — Telehealth: Payer: Self-pay | Admitting: Oncology

## 2020-03-20 ENCOUNTER — Inpatient Hospital Stay: Payer: Self-pay | Admitting: Oncology

## 2020-03-20 NOTE — Telephone Encounter (Signed)
Patient phoned writer on this date and stated that she would not be able to attend appt due to having to work. Appt rescheduled to 04-03-20.

## 2020-03-21 ENCOUNTER — Other Ambulatory Visit: Payer: Self-pay | Admitting: Oncology

## 2020-04-03 ENCOUNTER — Inpatient Hospital Stay: Payer: BLUE CROSS/BLUE SHIELD | Attending: Oncology | Admitting: Oncology

## 2020-04-03 ENCOUNTER — Other Ambulatory Visit: Payer: Self-pay

## 2020-04-03 VITALS — BP 130/61 | HR 74 | Temp 98.3°F | Resp 18 | Wt 222.0 lb

## 2020-04-03 DIAGNOSIS — C50411 Malignant neoplasm of upper-outer quadrant of right female breast: Secondary | ICD-10-CM | POA: Insufficient documentation

## 2020-04-03 DIAGNOSIS — Z170 Estrogen receptor positive status [ER+]: Secondary | ICD-10-CM | POA: Insufficient documentation

## 2020-04-03 DIAGNOSIS — Z79811 Long term (current) use of aromatase inhibitors: Secondary | ICD-10-CM | POA: Diagnosis not present

## 2020-04-03 DIAGNOSIS — Z5181 Encounter for therapeutic drug level monitoring: Secondary | ICD-10-CM

## 2020-04-03 DIAGNOSIS — Z08 Encounter for follow-up examination after completed treatment for malignant neoplasm: Secondary | ICD-10-CM

## 2020-04-03 DIAGNOSIS — Z853 Personal history of malignant neoplasm of breast: Secondary | ICD-10-CM

## 2020-04-05 NOTE — Progress Notes (Signed)
Hematology/Oncology Consult note [facility]  [phone] [phone]  Patient Care Team: Marinda Elk, MD as PCP - General (Physician Assistant)   Name of the patient: Angela Glass  [id number]  1961-07-03   Date of visit: 04/05/20  Diagnosis- pathological prognostic stage IA pT1b PN0CM0 grade 1 invasive lobular carcinoma ER PR positive and HER-2/neu negative status post lumpectomy   Chief complaint/ Reason for visit-routine follow-up of breast cancer  Heme/Onc history: patient is a 59 year old female who has been getting routine screening mammograms and her most recent mammogram in October 2019 showed a possible distortion in the left breast which was biopsied and showed invasive lobular carcinoma grade 1 strongly ER PR positive HER-2/neu negative 6 mm. Patient also had an MRI which showed non-mass enhancement of 2.8 x 1.5 x 0.8 cm in the upper outer quadrant of the left breast. No abnormal appearing lymph nodes.Oncotype score came back at low risk and she did not require adjuvant chemotherapy. She completed adjuvant radiation treatment and was started on Arimidex in March 2020.   Interval history-patient is currently doing well and tolerating Arimidex along with calcium and vitamin D well without any significant side effects.  Appetite and weight have remained stable.  Denies any new aches or pains anywhere.  ECOG PS- 1 Pain scale- 0   Review of systems- Review of Systems  Constitutional: Negative for chills, fever, malaise/fatigue and weight loss.  HENT: Negative for congestion, ear discharge and nosebleeds.   Eyes: Negative for blurred vision.  Respiratory: Negative for cough, hemoptysis, sputum production, shortness of breath and wheezing.   Cardiovascular: Negative for chest pain, palpitations, orthopnea and claudication.  Gastrointestinal: Negative for abdominal pain, blood in stool, constipation, diarrhea,  heartburn, melena, nausea and vomiting.  Genitourinary: Negative for dysuria, flank pain, frequency, hematuria and urgency.  Musculoskeletal: Negative for back pain, joint pain and myalgias.  Skin: Negative for rash.  Neurological: Negative for dizziness, tingling, focal weakness, seizures, weakness and headaches.  Endo/Heme/Allergies: Does not bruise/bleed easily.  Psychiatric/Behavioral: Negative for depression and suicidal ideas. The patient does not have insomnia.        Allergies  Allergen Reactions  . Lisinopril-Hydrochlorothiazide Cough     Past Medical History:  Diagnosis Date  . Anxiety   . Breast cancer ([place]) 07/2018   Invasive lobular carcinoma  . Breast cancer of upper-outer quadrant of left female breast ([place]) 09/28/2018   Invasive lobular carcinoma, 2 foci 6 mm and 3 mm, T1b, N0, ER/PR positive, HER-2/neu not overexpressed.  Whole breast radiation  . Diabetes mellitus without complication ([place])   . GERD (gastroesophageal reflux disease)   . Hyperlipidemia   . Hypertension   . Personal history of radiation therapy    Left breast     Past Surgical History:  Procedure Laterality Date  . BREAST BIOPSY Left 08/17/2018   Affirm bx(ribbon clip), invasive lobular carcinoma  . BREAST LUMPECTOMY Left 09/28/2018   ILC  . BREAST LUMPECTOMY WITH NEEDLE LOCALIZATION Left 09/28/2018   Procedure: BREAST LUMPECTOMY WITH NEEDLE LOCALIZATION;  Surgeon: Robert Bellow, MD;  Location: [facility] ORS;  Service: General;  Laterality: Left;  . MOUTH SURGERY     DENTAL IMPLANT AND GUM SURGERY  . SENTINEL NODE BIOPSY Left 09/28/2018   Procedure: SENTINEL NODE BIOPSY;  Surgeon: Robert Bellow, MD;  Location: [facility] ORS;  Service: General;  Laterality: Left;    Social History   Socioeconomic History  . Marital status: Widowed  Spouse name: Not on file  . Number of children: Not on file  . Years of education: Not on file  . Highest education level: Not on file  Occupational  History  . Not on file  Tobacco Use  . Smoking status: Never Smoker  . Smokeless tobacco: Never Used  Substance and Sexual Activity  . Alcohol use: Yes    Comment: ocassional  . Drug use: Never  . Sexual activity: Not on file  Other Topics Concern  . Not on file  Social History Narrative  . Not on file   Social Determinants of Health   Financial Resource Strain:   . Difficulty of Paying Living Expenses:   Food Insecurity:   . Worried About Charity fundraiser in the Last Year:   . Arboriculturist in the Last Year:   Transportation Needs:   . Film/video editor (Medical):   Marland Kitchen Lack of Transportation (Non-Medical):   Physical Activity:   . Days of Exercise per Week:   . Minutes of Exercise per Session:   Stress:   . Feeling of Stress :   Social Connections:   . Frequency of Communication with Friends and Family:   . Frequency of Social Gatherings with Friends and Family:   . Attends Religious Services:   . Active Member of Clubs or Organizations:   . Attends Archivist Meetings:   Marland Kitchen Marital Status:   Intimate Partner Violence:   . Fear of Current or Ex-Partner:   . Emotionally Abused:   Marland Kitchen Physically Abused:   . Sexually Abused:     Family History  Problem Relation Age of Onset  . Throat cancer Mother 31  . COPD Mother   . Healthy Father   . Healthy Brother   . Lung cancer Maternal Uncle   . Lung cancer Paternal Grandfather   . Heart attack Brother 26  . Lung cancer Maternal Uncle   . Breast cancer Neg Hx      Current Outpatient Medications:  .  ALPRAZolam (XANAX) 0.5 MG tablet, Take 0.5 mg by mouth daily as needed for anxiety. , Disp: , Rfl:  .  anastrozole (ARIMIDEX) 1 MG tablet, TAKE 1 TABLET BY MOUTH EVERY DAY, Disp: 30 tablet, Rfl: 2 .  Calcium Carbonate-Vitamin D (CALCIUM HIGH POTENCY/VITAMIN D) 600-200 MG-UNIT TABS, Take 1 tablet by mouth 2 (two) times a day. , Disp: , Rfl:  .  losartan-hydrochlorothiazide (HYZAAR) 100-12.5 MG tablet,  Take 1 tablet by mouth daily. , Disp: , Rfl:  .  omeprazole (PRILOSEC) 40 MG capsule, Take 40 mg by mouth daily. , Disp: , Rfl: 4 .  rosuvastatin (CRESTOR) 10 MG tablet, Take 10 mg by mouth at bedtime. , Disp: , Rfl:  .  venlafaxine XR (EFFEXOR-XR) 37.5 MG 24 hr capsule, Take 112.5 mg by mouth daily with breakfast. , Disp: , Rfl: 3 .  metFORMIN (GLUCOPHAGE) 500 MG tablet, Take 500 mg by mouth daily with breakfast. , Disp: , Rfl:   Physical exam:  Vitals:   04/03/20 1029  BP: 130/61  Pulse: 74  Resp: 18  Temp: 98.3 F (36.8 C)  TempSrc: Oral  SpO2: 98%  Weight: 222 lb (100.7 kg)   Physical Exam Constitutional:      General: She is not in acute distress. Cardiovascular:     Rate and Rhythm: Normal rate and regular rhythm.     Heart sounds: Normal heart sounds.  Pulmonary:     Effort: Pulmonary effort  is normal.     Breath sounds: Normal breath sounds.  Abdominal:     General: Bowel sounds are normal.     Palpations: Abdomen is soft.  Skin:    General: Skin is warm and dry.  Neurological:     Mental Status: She is alert and oriented to person, place, and time.   Breast exam: There is significant radiation fibrosis and indurated field to the left breast which makes left breast exam limited.  No palpable breast masses in either breast.  No palpable bilateral axillary adenopathy.  CMP Latest Ref Rng & Units 05/18/2019  Glucose 70 - 99 mg/dL 107(H)  BUN 6 - 20 mg/dL 15  Creatinine 0.44 - 1.00 mg/dL 0.83  Sodium 135 - 145 mmol/L 137  Potassium 3.5 - 5.1 mmol/L 3.3(L)  Chloride 98 - 111 mmol/L 100  CO2 22 - 32 mmol/L 26  Calcium 8.9 - 10.3 mg/dL 9.3  Total Protein 6.5 - 8.1 g/dL 7.4  Total Bilirubin 0.3 - 1.2 mg/dL 0.5  Alkaline Phos 38 - 126 U/L 54  AST 15 - 41 U/L 17  ALT 0 - 44 U/L 15   CBC Latest Ref Rng & Units 05/18/2019  WBC 4.0 - 10.5 K/uL 8.7  Hemoglobin 12.0 - 15.0 g/dL 13.4  Hematocrit 36.0 - 46.0 % 40.1  Platelets 150 - 400 K/uL 255     Assessment and  plan- Patient is a 59 y.o. female with with history of stage Ia ER PR positive HER-2/neu negative invasive lobular carcinoma of the left breast status post lumpectomy and adjuvant radiation treatment.   This is a routine follow-up visit of breast cancer on Arimidex  Patient is doing well on Arimidex with no significant signs and symptoms concerning for recurrence.  She started taking Arimidex in March 2020 and will continue to take it for 5 years.  She will also take her calcium and vitamin D.I will see her back in 6 months for video visit.  She will be due for a mammogram in October 2021 which will be coordinated by Dr. Dahlia Byes.   Visit Diagnosis 1. Visit for monitoring Arimidex therapy   2. Encounter for follow-up surveillance of breast cancer      Dr. Randa Evens, MD, MPH [facility] at [facility] [phone] 04/05/2020 9:29 AM

## 2020-06-19 ENCOUNTER — Other Ambulatory Visit: Payer: Self-pay | Admitting: Oncology

## 2020-07-05 ENCOUNTER — Other Ambulatory Visit: Payer: Self-pay

## 2020-07-05 DIAGNOSIS — C50912 Malignant neoplasm of unspecified site of left female breast: Secondary | ICD-10-CM

## 2020-08-03 ENCOUNTER — Other Ambulatory Visit: Payer: Self-pay

## 2020-08-03 ENCOUNTER — Ambulatory Visit
Admission: RE | Admit: 2020-08-03 | Discharge: 2020-08-03 | Disposition: A | Discharge status: Home / Self Care | Payer: BLUE CROSS/BLUE SHIELD | Source: Ambulatory Visit | Attending: Surgery | Admitting: Surgery

## 2020-08-03 ENCOUNTER — Other Ambulatory Visit: Payer: Self-pay | Admitting: Surgery

## 2020-08-03 ENCOUNTER — Telehealth: Payer: Self-pay

## 2020-08-03 DIAGNOSIS — C50912 Malignant neoplasm of unspecified site of left female breast: Secondary | ICD-10-CM

## 2020-08-03 DIAGNOSIS — R928 Other abnormal and inconclusive findings on diagnostic imaging of breast: Secondary | ICD-10-CM

## 2020-08-03 DIAGNOSIS — R921 Mammographic calcification found on diagnostic imaging of breast: Secondary | ICD-10-CM

## 2020-08-03 NOTE — Telephone Encounter (Signed)
-----   Message from Jules Husbands, MD sent at 08/03/2020  2:06 PM EDT ----- She will need stereotactic biopsy of breast calcifications, usually breast center does and schedule those but please make sure this is the case, thanks ----- Message ----- From: Interface, Rad Results In Sent: 08/03/2020   1:44 PM EDT To: Jules Husbands, MD

## 2020-08-03 NOTE — Telephone Encounter (Signed)
Per [facility] they will contact the patient to get scheduled for the stereotatic breast biopsy.

## 2020-08-09 ENCOUNTER — Ambulatory Visit: Payer: Self-pay | Admitting: Surgery

## 2020-08-10 ENCOUNTER — Ambulatory Visit
Admission: RE | Admit: 2020-08-10 | Discharge: 2020-08-10 | Disposition: A | Discharge status: Home / Self Care | Payer: BLUE CROSS/BLUE SHIELD | Source: Ambulatory Visit | Attending: Surgery | Admitting: Surgery

## 2020-08-10 ENCOUNTER — Other Ambulatory Visit: Payer: Self-pay

## 2020-08-10 DIAGNOSIS — R928 Other abnormal and inconclusive findings on diagnostic imaging of breast: Secondary | ICD-10-CM | POA: Diagnosis not present

## 2020-08-10 DIAGNOSIS — R921 Mammographic calcification found on diagnostic imaging of breast: Secondary | ICD-10-CM | POA: Diagnosis present

## 2020-08-10 HISTORY — PX: BREAST BIOPSY: SHX20

## 2020-08-11 LAB — SURGICAL PATHOLOGY

## 2020-08-23 ENCOUNTER — Ambulatory Visit: Payer: Self-pay | Admitting: Surgery

## 2020-10-03 ENCOUNTER — Inpatient Hospital Stay: Payer: BLUE CROSS/BLUE SHIELD | Admitting: Oncology

## 2020-11-21 ENCOUNTER — Inpatient Hospital Stay: Payer: Self-pay | Attending: Oncology | Admitting: Oncology

## 2020-11-21 DIAGNOSIS — Z08 Encounter for follow-up examination after completed treatment for malignant neoplasm: Secondary | ICD-10-CM

## 2020-11-21 DIAGNOSIS — Z5181 Encounter for therapeutic drug level monitoring: Secondary | ICD-10-CM

## 2020-11-21 DIAGNOSIS — Z853 Personal history of malignant neoplasm of breast: Secondary | ICD-10-CM

## 2020-11-21 DIAGNOSIS — M85851 Other specified disorders of bone density and structure, right thigh: Secondary | ICD-10-CM

## 2020-11-21 DIAGNOSIS — Z79811 Long term (current) use of aromatase inhibitors: Secondary | ICD-10-CM

## 2020-11-24 NOTE — Progress Notes (Signed)
I connected with Angela Glass on 11/24/20 at  2:15 PM EST by video enabled telemedicine visit and verified that I am speaking with the correct person using two identifiers.   I discussed the limitations, risks, security and privacy concerns of performing an evaluation and management service by telemedicine and the availability of in-person appointments. I also discussed with the patient that there may be a patient responsible charge related to this service. The patient expressed understanding and agreed to proceed.  Other persons participating in the visit and their role in the encounter:  none  Patient's location:  home Provider's location:  Work  Diagnosis:pathological prognostic stage IA pT1b PN0CM0 grade 1 invasive lobular carcinoma ER PR positive and HER-2/neu negative status post lumpectomy   Chief Complaint: Routine follow-up of breast cancer  History of present illness: patient is a 60 year old female who has been getting routine screening mammograms and her most recent mammogram in October 2019 showed a possible distortion in the left breast which was biopsied and showed invasive lobular carcinoma grade 1 strongly ER PR positive HER-2/neu negative 6 mm. Patient also had an MRI which showed non-mass enhancement of 2.8 x 1.5 x 0.8 cm in the upper outer quadrant of the left breast. No abnormal appearing lymph nodes.Oncotype score came back at low risk and she did not require adjuvant chemotherapy. She completed adjuvant radiation treatment and was started on Arimidex in March 2020.   Interval history patient reports doing well and denies any breast concerns at this time.  Appetite and weight have remained stable.  Denies any new aches and pains anywhere   Review of Systems  Constitutional: Negative for chills, fever, malaise/fatigue and weight loss.  HENT: Negative for congestion, ear discharge and nosebleeds.   Eyes: Negative for blurred vision.  Respiratory: Negative for  cough, hemoptysis, sputum production, shortness of breath and wheezing.   Cardiovascular: Negative for chest pain, palpitations, orthopnea and claudication.  Gastrointestinal: Negative for abdominal pain, blood in stool, constipation, diarrhea, heartburn, melena, nausea and vomiting.  Genitourinary: Negative for dysuria, flank pain, frequency, hematuria and urgency.  Musculoskeletal: Negative for back pain, joint pain and myalgias.  Skin: Negative for rash.  Neurological: Negative for dizziness, tingling, focal weakness, seizures, weakness and headaches.  Endo/Heme/Allergies: Does not bruise/bleed easily.  Psychiatric/Behavioral: Negative for depression and suicidal ideas. The patient does not have insomnia.     Allergies  Allergen Reactions  . Lisinopril-Hydrochlorothiazide Cough    Past Medical History:  Diagnosis Date  . Anxiety   . Breast cancer ([place]) 07/2018   Invasive lobular carcinoma  . Breast cancer of upper-outer quadrant of left female breast ([place]) 09/28/2018   Invasive lobular carcinoma, 2 foci 6 mm and 3 mm, T1b, N0, ER/PR positive, HER-2/neu not overexpressed.  Whole breast radiation  . Diabetes mellitus without complication ([place])   . GERD (gastroesophageal reflux disease)   . Hyperlipidemia   . Hypertension   . Personal history of radiation therapy    Left breast    Past Surgical History:  Procedure Laterality Date  . BREAST BIOPSY Left 08/17/2018   Affirm bx(ribbon clip), invasive lobular carcinoma  . BREAST BIOPSY Left 08/10/2020   affirm bx- coil clip, path pending  . BREAST LUMPECTOMY Left 09/28/2018   ILC  . BREAST LUMPECTOMY WITH NEEDLE LOCALIZATION Left 09/28/2018   Procedure: BREAST LUMPECTOMY WITH NEEDLE LOCALIZATION;  Surgeon: Robert Bellow, MD;  Location: [facility] ORS;  Service: General;  Laterality: Left;  . [place]  IMPLANT AND GUM SURGERY  . SENTINEL NODE BIOPSY Left 09/28/2018   Procedure: SENTINEL NODE BIOPSY;  Surgeon:  Robert Bellow, MD;  Location: [facility] ORS;  Service: General;  Laterality: Left;    Social History   Socioeconomic History  . Marital status: Widowed    Spouse name: Not on file  . Number of children: Not on file  . Years of education: Not on file  . Highest education level: Not on file  Occupational History  . Not on file  Tobacco Use  . Smoking status: Never Smoker  . Smokeless tobacco: Never Used  Vaping Use  . Vaping Use: Never used  Substance and Sexual Activity  . Alcohol use: Yes    Comment: ocassional  . Drug use: Never  . Sexual activity: Not on file  Other Topics Concern  . Not on file  Social History Narrative  . Not on file   Social Determinants of Health   Financial Resource Strain: Not on file  Food Insecurity: Not on file  Transportation Needs: Not on file  Physical Activity: Not on file  Stress: Not on file  Social Connections: Not on file  Intimate Partner Violence: Not on file    Family History  Problem Relation Age of Onset  . Throat cancer Mother 2  . COPD Mother   . Healthy Father   . Healthy Brother   . Lung cancer Maternal Uncle   . Lung cancer Paternal Grandfather   . Heart attack Brother 78  . Lung cancer Maternal Uncle   . Breast cancer Neg Hx      Current Outpatient Medications:  .  ALPRAZolam (XANAX) 0.5 MG tablet, Take 0.5 mg by mouth daily as needed for anxiety. , Disp: , Rfl:  .  anastrozole (ARIMIDEX) 1 MG tablet, Take 1 tablet (1 mg total) by mouth daily., Disp: 90 tablet, Rfl: 2 .  Calcium Carbonate-Vitamin D 600-200 MG-UNIT TABS, Take 1 tablet by mouth 2 (two) times a day. , Disp: , Rfl:  .  losartan-hydrochlorothiazide (HYZAAR) 100-12.5 MG tablet, Take 1 tablet by mouth daily. , Disp: , Rfl:  .  omeprazole (PRILOSEC) 40 MG capsule, Take 40 mg by mouth daily., Disp: , Rfl: 4 .  potassium chloride (KLOR-CON) 10 MEQ tablet, TAKE 1 TABLET(10 MEQ) BY MOUTH EVERY DAY, Disp: , Rfl:  .  rosuvastatin (CRESTOR) 10 MG tablet,  Take 10 mg by mouth at bedtime. , Disp: , Rfl:  .  venlafaxine XR (EFFEXOR-XR) 37.5 MG 24 hr capsule, Take 112.5 mg by mouth daily with breakfast. , Disp: , Rfl: 3 .  albuterol (VENTOLIN HFA) 108 (90 Base) MCG/ACT inhaler, SMARTSIG:2 Inhalation Via Inhaler Every 6 Hours PRN, Disp: , Rfl:  .  metFORMIN (GLUCOPHAGE) 500 MG tablet, Take 500 mg by mouth daily with breakfast. , Disp: , Rfl:   No results found.  No images are attached to the encounter.   CMP Latest Ref Rng & Units 05/18/2019  Glucose 70 - 99 mg/dL 107(H)  BUN 6 - 20 mg/dL 15  Creatinine 0.44 - 1.00 mg/dL 0.83  Sodium 135 - 145 mmol/L 137  Potassium 3.5 - 5.1 mmol/L 3.3(L)  Chloride 98 - 111 mmol/L 100  CO2 22 - 32 mmol/L 26  Calcium 8.9 - 10.3 mg/dL 9.3  Total Protein 6.5 - 8.1 g/dL 7.4  Total Bilirubin 0.3 - 1.2 mg/dL 0.5  Alkaline Phos 38 - 126 U/L 54  AST 15 - 41 U/L 17  ALT 0 - 44  U/L 15   CBC Latest Ref Rng & Units 05/18/2019  WBC 4.0 - 10.5 K/uL 8.7  Hemoglobin 12.0 - 15.0 g/dL 13.4  Hematocrit 36.0 - 46.0 % 40.1  Platelets 150 - 400 K/uL 255     Observation/objective: Appears in no acute distress over video visit today.  Breathing is nonlabored  Assessment and plan:Patient is a 60 y.o. female with with history of stage Ia ER PR positive HER-2/neu negative invasive lobular carcinoma of the left breast status post lumpectomy and adjuvant radiation treatment.  With routine follow-up of breast cancer on Arimidex  Patient recently had a mammogram in October 2021 which showed possible new calcifications in her left breast which were biopsied biopsy was negative for malignancy and showed benign breast tissue with fibrosis and coarse calcifications.  Left breast evaluation was limited due to prior radiation-induced fibrosis and annual MRI was recommended for left breast assessment.  I will therefore plan to get a bilateral breast MRI in April 2022 and will alternate mammogram with MRI moving forward.  Patient will  continue to take Arimidex for 5 years ending in 2025 along with calcium and vitamin D.  She will also be due for a bone density scan sometime in June 2022  Follow-up instructions: As above  I discussed the assessment and treatment plan with the patient. The patient was provided an opportunity to ask questions and all were answered. The patient agreed with the plan and demonstrated an understanding of the instructions.   The patient was advised to call back or seek an in-person evaluation if the symptoms worsen or if the condition fails to improve as anticipated.   Visit Diagnosis: 1. Visit for monitoring Arimidex therapy   2. Encounter for follow-up surveillance of breast cancer     Dr. Randa Evens, MD, MPH [facility] at [facility] Tel- [phone] 11/24/2020 8:51 AM

## 2021-01-04 ENCOUNTER — Ambulatory Visit
Admission: RE | Admit: 2021-01-04 | Discharge: 2021-01-04 | Disposition: A | Discharge status: Home / Self Care | Payer: 59 | Source: Ambulatory Visit | Attending: Radiation Oncology | Admitting: Radiation Oncology

## 2021-01-04 ENCOUNTER — Encounter: Payer: Self-pay | Admitting: Radiation Oncology

## 2021-01-04 ENCOUNTER — Other Ambulatory Visit: Payer: Self-pay

## 2021-01-04 VITALS — Temp 97.7°F | Wt 228.6 lb

## 2021-01-04 DIAGNOSIS — Z923 Personal history of irradiation: Secondary | ICD-10-CM | POA: Insufficient documentation

## 2021-01-04 DIAGNOSIS — Z170 Estrogen receptor positive status [ER+]: Secondary | ICD-10-CM | POA: Insufficient documentation

## 2021-01-04 DIAGNOSIS — C50912 Malignant neoplasm of unspecified site of left female breast: Secondary | ICD-10-CM

## 2021-01-04 DIAGNOSIS — Z79811 Long term (current) use of aromatase inhibitors: Secondary | ICD-10-CM | POA: Insufficient documentation

## 2021-01-04 DIAGNOSIS — C50412 Malignant neoplasm of upper-outer quadrant of left female breast: Secondary | ICD-10-CM | POA: Insufficient documentation

## 2021-01-04 NOTE — Progress Notes (Signed)
Radiation Oncology Follow up Note  Name: Angela Glass   Date:   01/04/2021 MRN:  [id number] DOB: 1961/02/07    This 60 y.o. female presents to the clinic today for 2-year follow-up status post hypofractionated radiation therapy to her left breast for stage I ER/PR positive invasive lobular carcinoma.  REFERRING PROVIDER: Marinda Elk, MD  HPI: Patient is a 60 year old female now 2 years out having completed hypofractionated radiation therapy to her left breast for stage I ER/PR positive invasive lobular carcinoma.  She is seen today in routine follow-up is doing well her breast has remained shrunken with a poor to fair cosmetic result and is somewhat lymphedematous..  There were some new left breast calcifications on her mammogram back in October 2021 for which biopsy was obtained stereotactically.  Biopsy showed benign breast tissue with fibrous and coarse calcifications negative for atypia and malignancy.  She is currently on Arimidex tolerating it well without side effect she is absolutely asymptomatic as far as breast tenderness is concerned.  COMPLICATIONS OF TREATMENT: none  FOLLOW UP COMPLIANCE: keeps appointments   PHYSICAL EXAM:  Temp 97.7 F (36.5 C) (Tympanic)   Wt 228 lb 9.6 oz (103.7 kg)   BMI 44.65 kg/m  Left breast is shrunken and somewhat edematous no dominant masses noted in either breast.  No axillary or supraclavicular adenopathy is identified.  Well-developed well-nourished patient in NAD. HEENT reveals PERLA, EOMI, discs not visualized.  Oral cavity is clear. No oral mucosal lesions are identified. Neck is clear without evidence of cervical or supraclavicular adenopathy. Lungs are clear to A&P. Cardiac examination is essentially unremarkable with regular rate and rhythm without murmur rub or thrill. Abdomen is benign with no organomegaly or masses noted. Motor sensory and DTR levels are equal and symmetric in the upper and lower extremities. Cranial  nerves II through XII are grossly intact. Proprioception is intact. No peripheral adenopathy or edema is identified. No motor or sensory levels are noted. Crude visual fields are within normal range.  RADIOLOGY RESULTS: Mammograms reviewed compatible with above-stated findings  PLAN: This time she continues to do well with no evidence of disease.  And pleased with her overall progress.  She will continue have yearly mammograms should they have any other problems I have suggested MRI scan based on mammographic findings.  Patient comprehends my recommendations well.  She continues on Arimidex without side effect.  I have asked to see her out in 1 year for follow-up.  Patient knows to call with any concerns.  I would like to take this opportunity to thank you for allowing me to participate in the care of your patient.[name], MD

## 2021-02-09 ENCOUNTER — Ambulatory Visit: Payer: 59

## 2021-02-21 ENCOUNTER — Ambulatory Visit: Payer: 59

## 2021-03-06 ENCOUNTER — Telehealth: Payer: Self-pay | Admitting: *Deleted

## 2021-03-06 NOTE — Telephone Encounter (Signed)
I called Angela Glass and said that I did not get the form that faxed to her to sign and get back in order to send it for the appeal for MRI. She was going ahead and paying out of her pocket and I asked her if she wants to wait 1 month so that we can do the appeal and if they still deny it then pt. Can still have it done and pay out of her pocket like she had planned. I feel like that if she gets mri and then insurance approves the appeal because she already had it done ; they may not cover. Pt . Thinks that is a good idea so I called and moved her appt MRI to June 8 arrive 8:45. Pt agreeable to the above plan

## 2021-03-07 ENCOUNTER — Telehealth: Payer: Self-pay | Admitting: *Deleted

## 2021-03-07 NOTE — Telephone Encounter (Signed)
Pt in agreement for appeal to get MRI breast. This form with pt signature was faxed today and according to the process it can take up to 30 days. I have spoke to pt and she is aeare of this and we rescheduled her MRI to 6/8.

## 2021-03-08 ENCOUNTER — Telehealth: Payer: Self-pay | Admitting: *Deleted

## 2021-03-08 ENCOUNTER — Ambulatory Visit: Payer: 59

## 2021-03-08 NOTE — Telephone Encounter (Signed)
I left pt a message that the appeal for MRI of breast was approved and the referral # [id number] and it is good to get the MRI from 03/08/2021 through 04/07/2021. I told her that I will ask mammography to move her up for the MRI.

## 2021-03-16 ENCOUNTER — Other Ambulatory Visit: Payer: Self-pay | Admitting: Oncology

## 2021-04-02 ENCOUNTER — Telehealth: Payer: Self-pay | Admitting: *Deleted

## 2021-04-02 NOTE — Telephone Encounter (Signed)
Called pt to let her know that she was approved for MRI and when we got the approval it was for the left breast only. When we do breast MRI it is always bil. I checked with Webb Silversmith the navigator and asked about what to do. She spoke to radiologist and he said that it is always bil. And you can't shut the machine off for one or the other breast. It should be one code for the charge. If pt gets any  [employer] besides the copay she can call me and Webb Silversmith and I will work to cover it on [employer]. She is aware and will call if needed

## 2021-04-04 ENCOUNTER — Ambulatory Visit
Admission: RE | Admit: 2021-04-04 | Discharge: 2021-04-04 | Disposition: A | Discharge status: Home / Self Care | Payer: 59 | Source: Ambulatory Visit | Attending: Oncology | Admitting: Oncology

## 2021-04-04 ENCOUNTER — Other Ambulatory Visit: Payer: Self-pay

## 2021-04-04 DIAGNOSIS — Z08 Encounter for follow-up examination after completed treatment for malignant neoplasm: Secondary | ICD-10-CM | POA: Insufficient documentation

## 2021-04-04 DIAGNOSIS — Z79811 Long term (current) use of aromatase inhibitors: Secondary | ICD-10-CM | POA: Diagnosis present

## 2021-04-04 DIAGNOSIS — Z853 Personal history of malignant neoplasm of breast: Secondary | ICD-10-CM | POA: Insufficient documentation

## 2021-04-04 DIAGNOSIS — Z5181 Encounter for therapeutic drug level monitoring: Secondary | ICD-10-CM | POA: Diagnosis not present

## 2021-04-04 MED ORDER — GADOBUTROL 1 MMOL/ML IV SOLN
10.0000 mL | Freq: Once | INTRAVENOUS | Status: AC | PRN
  Administered 2021-04-04: 10 mL via INTRAVENOUS

## 2021-04-05 ENCOUNTER — Ambulatory Visit
Admission: RE | Admit: 2021-04-05 | Discharge: 2021-04-05 | Disposition: A | Discharge status: Home / Self Care | Payer: 59 | Source: Ambulatory Visit | Attending: Oncology | Admitting: Oncology

## 2021-04-05 DIAGNOSIS — M85851 Other specified disorders of bone density and structure, right thigh: Secondary | ICD-10-CM | POA: Insufficient documentation

## 2021-05-21 ENCOUNTER — Other Ambulatory Visit: Payer: Self-pay

## 2021-05-21 ENCOUNTER — Encounter: Payer: Self-pay | Admitting: Oncology

## 2021-05-21 ENCOUNTER — Inpatient Hospital Stay: Payer: 59 | Attending: Oncology | Admitting: Oncology

## 2021-05-21 ENCOUNTER — Inpatient Hospital Stay: Payer: 59 | Admitting: Oncology

## 2021-05-21 VITALS — BP 155/90 | HR 78 | Temp 99.4°F | Resp 16 | Wt 226.9 lb

## 2021-05-21 DIAGNOSIS — Z170 Estrogen receptor positive status [ER+]: Secondary | ICD-10-CM | POA: Diagnosis not present

## 2021-05-21 DIAGNOSIS — Z79811 Long term (current) use of aromatase inhibitors: Secondary | ICD-10-CM | POA: Diagnosis not present

## 2021-05-21 DIAGNOSIS — C50412 Malignant neoplasm of upper-outer quadrant of left female breast: Secondary | ICD-10-CM | POA: Insufficient documentation

## 2021-05-21 DIAGNOSIS — Z853 Personal history of malignant neoplasm of breast: Secondary | ICD-10-CM | POA: Diagnosis not present

## 2021-05-21 DIAGNOSIS — C50912 Malignant neoplasm of unspecified site of left female breast: Secondary | ICD-10-CM | POA: Diagnosis not present

## 2021-05-21 DIAGNOSIS — Z08 Encounter for follow-up examination after completed treatment for malignant neoplasm: Secondary | ICD-10-CM

## 2021-05-21 DIAGNOSIS — M8580 Other specified disorders of bone density and structure, unspecified site: Secondary | ICD-10-CM | POA: Insufficient documentation

## 2021-05-21 NOTE — Progress Notes (Signed)
MM s    Hematology/Oncology Consult note [facility]  Telephone:([phone] Fax:([phone]  Patient Care Team: Marinda Elk, MD as PCP - General (Physician Assistant)   Name of the patient: Angela Glass  [id number]  [birth year]   Date of visit: 05/21/21  Diagnosis- pathological prognostic stage I A p T1b PN0CM0 grade 1 invasive lobular carcinoma ER PR positive and HER-2/neu negative status post lumpectomy    Chief complaint/ Reason for visit-routine follow-up of breast cancer  Heme/Onc history: patient is a 60 year old female who has been getting routine screening mammograms and her most recent mammogram in October 2019 showed a possible distortion in the left breast which was biopsied and showed invasive lobular carcinoma grade 1 strongly ER PR positive HER-2/neu negative 6 mm.  Patient also had an MRI which showed non-mass enhancement of 2.8 x 1.5 x 0.8 cm in the upper outer quadrant of the left breast.  No abnormal appearing lymph nodes.   Oncotype score came back at low risk and she did not require adjuvant chemotherapy.  She completed adjuvant radiation treatment and was started on Arimidex in March 2020.     Interval history-Angela Glass is currently doing well and tolerating Arimidex well.  She continues calcium and vitamin D for osteopenia.  Reports occasional tenderness to recent biopsy site on left breast.  Appetite and weight have been stable.  ECOG PS- 1 Pain scale- 0   Review of systems- Review of Systems  Constitutional: Negative.  Negative for chills, fever, malaise/fatigue and weight loss.  HENT:  Negative for congestion, ear pain and tinnitus.   Eyes: Negative.  Negative for blurred vision and double vision.  Respiratory: Negative.  Negative for cough, sputum production and shortness of breath.   Cardiovascular: Negative.  Negative for chest pain, palpitations and leg swelling.  Gastrointestinal: Negative.  Negative for abdominal  pain, constipation, diarrhea, nausea and vomiting.  Genitourinary:  Negative for dysuria, frequency and urgency.  Musculoskeletal:  Negative for back pain and falls.  Skin: Negative.  Negative for rash.       Tenderness at biopsy site of left breast  Neurological: Negative.  Negative for weakness and headaches.  Endo/Heme/Allergies: Negative.  Does not bruise/bleed easily.  Psychiatric/Behavioral: Negative.  Negative for depression. The patient is not nervous/anxious and does not have insomnia.       Allergies  Allergen Reactions   Lisinopril-Hydrochlorothiazide Cough     Past Medical History:  Diagnosis Date   Anxiety    Breast cancer ([place]) 07/2018   Invasive lobular carcinoma   Breast cancer of upper-outer quadrant of left female breast ([place]) 09/28/2018   Invasive lobular carcinoma, 2 foci 6 mm and 3 mm, T1b, N0, ER/PR positive, HER-2/neu not overexpressed.  Whole breast radiation   Diabetes mellitus without complication (HCC)    GERD (gastroesophageal reflux disease)    Hyperlipidemia    Hypertension    Personal history of radiation therapy    Left breast     Past Surgical History:  Procedure Laterality Date   BREAST BIOPSY Left 08/17/2018   Affirm bx(ribbon clip), invasive lobular carcinoma   BREAST BIOPSY Left 08/10/2020   affirm bx- coil clip, path pending   BREAST LUMPECTOMY Left 09/28/2018   [place]   BREAST LUMPECTOMY WITH NEEDLE LOCALIZATION Left 09/28/2018   Procedure: BREAST LUMPECTOMY WITH NEEDLE LOCALIZATION;  Surgeon: Robert Bellow, MD;  Location: [facility] ORS;  Service: General;  Laterality: Left;   [place]  AND GUM SURGERY   SENTINEL NODE BIOPSY Left 09/28/2018   Procedure: SENTINEL NODE BIOPSY;  Surgeon: Robert Bellow, MD;  Location: [facility] ORS;  Service: General;  Laterality: Left;    Social History   Socioeconomic History   Marital status: Widowed    Spouse name: Not on file   Number of children: Not on file   Years of  education: Not on file   Highest education level: Not on file  Occupational History   Not on file  Tobacco Use   Smoking status: Never   Smokeless tobacco: Never  Vaping Use   Vaping Use: Never used  Substance and Sexual Activity   Alcohol use: Yes    Comment: ocassional   Drug use: Never   Sexual activity: Not on file  Other Topics Concern   Not on file  Social History Narrative   Not on file   Social Determinants of Health   Financial Resource Strain: Not on file  Food Insecurity: Not on file  Transportation Needs: Not on file  Physical Activity: Not on file  Stress: Not on file  Social Connections: Not on file  Intimate Partner Violence: Not on file    Family History  Problem Relation Age of Onset   Throat cancer Mother 54   COPD Mother    Healthy Father    Healthy Brother    Lung cancer Maternal Uncle    Lung cancer Paternal Grandfather    Heart attack Brother 64   Lung cancer Maternal Uncle    Breast cancer Neg Hx      Current Outpatient Medications:    albuterol (VENTOLIN HFA) 108 (90 Base) MCG/ACT inhaler, SMARTSIG:2 Inhalation Via Inhaler Every 6 Hours PRN, Disp: , Rfl:    ALPRAZolam (XANAX) 0.5 MG tablet, Take 0.5 mg by mouth daily as needed for anxiety. , Disp: , Rfl:    anastrozole (ARIMIDEX) 1 MG tablet, TAKE 1 TABLET(1 MG) BY MOUTH DAILY, Disp: 90 tablet, Rfl: 2   Calcium Carbonate-Vitamin D 600-200 MG-UNIT TABS, Take 1 tablet by mouth 2 (two) times a day. , Disp: , Rfl:    losartan-hydrochlorothiazide (HYZAAR) 100-12.5 MG tablet, Take 1 tablet by mouth daily. , Disp: , Rfl:    metFORMIN (GLUCOPHAGE) 500 MG tablet, Take 500 mg by mouth daily with breakfast. , Disp: , Rfl:    omeprazole (PRILOSEC) 40 MG capsule, Take 40 mg by mouth daily., Disp: , Rfl: 4   potassium chloride (KLOR-CON) 10 MEQ tablet, TAKE 1 TABLET(10 MEQ) BY MOUTH EVERY DAY, Disp: , Rfl:    rosuvastatin (CRESTOR) 10 MG tablet, Take 10 mg by mouth at bedtime. , Disp: , Rfl:     venlafaxine XR (EFFEXOR-XR) 37.5 MG 24 hr capsule, Take 112.5 mg by mouth daily with breakfast. , Disp: , Rfl: 3  Physical exam:  Vitals:   05/21/21 1524  BP: (!) 155/90  Pulse: 78  Resp: 16  Temp: 99.4 F (37.4 C)  Weight: 226 lb 14.4 oz (102.9 kg)   Physical Exam Constitutional:      Appearance: Normal appearance.  HENT:     Head: Normocephalic and atraumatic.  Eyes:     Pupils: Pupils are equal, round, and reactive to light.  Cardiovascular:     Rate and Rhythm: Normal rate and regular rhythm.     Heart sounds: Normal heart sounds. No murmur heard. Pulmonary:     Effort: Pulmonary effort is normal.     Breath sounds: Normal breath sounds. No wheezing.  Abdominal:     General: Bowel sounds are normal. There is no distension.     Palpations: Abdomen is soft.     Tenderness: There is no abdominal tenderness.  Musculoskeletal:        General: Normal range of motion.     Cervical back: Normal range of motion.  Skin:    General: Skin is warm and dry.     Findings: No rash.  Neurological:     Mental Status: She is alert and oriented to person, place, and time.  Psychiatric:        Judgment: Judgment normal.   CMP Latest Ref Rng & Units 05/18/2019  Glucose 70 - 99 mg/dL 107(H)  BUN 6 - 20 mg/dL 15  Creatinine 0.44 - 1.00 mg/dL 0.83  Sodium 135 - 145 mmol/L 137  Potassium 3.5 - 5.1 mmol/L 3.3(L)  Chloride 98 - 111 mmol/L 100  CO2 22 - 32 mmol/L 26  Calcium 8.9 - 10.3 mg/dL 9.3  Total Protein 6.5 - 8.1 g/dL 7.4  Total Bilirubin 0.3 - 1.2 mg/dL 0.5  Alkaline Phos 38 - 126 U/L 54  AST 15 - 41 U/L 17  ALT 0 - 44 U/L 15   CBC Latest Ref Rng & Units 05/18/2019  WBC 4.0 - 10.5 K/uL 8.7  Hemoglobin 12.0 - 15.0 g/dL 13.4  Hematocrit 36.0 - 46.0 % 40.1  Platelets 150 - 400 K/uL 255     Assessment and plan- Patient is a 60 y.o. female with with history of stage Ia ER PR positive HER-2/neu negative invasive lobular carcinoma of the left breast status post lumpectomy and  adjuvant radiation treatment.   This is a routine follow-up visit of breast cancer on Arimidex  She continues to do well on Arimidex with no significant signs and symptoms concerning for recurrence.  She did recently have a left breast MRI and biopsy which was negative for malignancy.  They recommend follow-up in 6 months.  She started taking Arimidex in March 2020 and will continue for 5 years.  She recently had a bone density which showed a T score of -1.3.  She is taking calcium and vitamin D.  She will be due for a diagnostic mammogram in October 2022.  Orders placed today.  She will return to clinic in 6 months for a virtual visit with Dr. Janese Banks.  Visit Diagnosis 1. Encounter for follow-up surveillance of breast cancer   2. Invasive lobular carcinoma of breast, stage 1, left (HCC)    I spent 15 minutes dedicated to the care of this patient (face-to-face and non-face-to-face) on the date of the encounter to include what is described in the assessment and plan.  Faythe Casa, NP 05/21/2021 3:51 PM

## 2021-05-21 NOTE — Progress Notes (Signed)
Patient denies new problems/concerns today.   °

## 2021-06-25 ENCOUNTER — Encounter: Payer: Self-pay | Admitting: *Deleted

## 2021-06-25 ENCOUNTER — Encounter: Payer: Self-pay | Admitting: Oncology

## 2021-06-25 ENCOUNTER — Other Ambulatory Visit: Payer: Self-pay | Admitting: *Deleted

## 2021-06-25 MED ORDER — LETROZOLE 2.5 MG PO TABS: 2.5000 mg | ORAL_TABLET | Freq: Every day | ORAL | 1 refills | Status: DC

## 2021-06-25 NOTE — Telephone Encounter (Signed)
Can we send her aromasin?

## 2021-08-07 ENCOUNTER — Ambulatory Visit
Admission: RE | Admit: 2021-08-07 | Discharge: 2021-08-07 | Disposition: A | Discharge status: Home / Self Care | Payer: 59 | Source: Ambulatory Visit | Attending: Oncology | Admitting: Oncology

## 2021-08-07 ENCOUNTER — Other Ambulatory Visit: Payer: Self-pay

## 2021-08-07 DIAGNOSIS — Z853 Personal history of malignant neoplasm of breast: Secondary | ICD-10-CM | POA: Insufficient documentation

## 2021-08-07 DIAGNOSIS — Z08 Encounter for follow-up examination after completed treatment for malignant neoplasm: Secondary | ICD-10-CM | POA: Insufficient documentation

## 2021-08-07 DIAGNOSIS — Z1231 Encounter for screening mammogram for malignant neoplasm of breast: Secondary | ICD-10-CM | POA: Diagnosis not present

## 2021-08-13 ENCOUNTER — Other Ambulatory Visit: Payer: Self-pay | Admitting: Oncology

## 2021-10-12 ENCOUNTER — Other Ambulatory Visit: Payer: Self-pay | Admitting: Oncology

## 2021-11-02 DIAGNOSIS — E782 Mixed hyperlipidemia: Secondary | ICD-10-CM | POA: Diagnosis not present

## 2021-11-02 DIAGNOSIS — Z0000 Encounter for general adult medical examination without abnormal findings: Secondary | ICD-10-CM | POA: Diagnosis not present

## 2021-11-02 DIAGNOSIS — R7303 Prediabetes: Secondary | ICD-10-CM | POA: Diagnosis not present

## 2021-11-02 DIAGNOSIS — I10 Essential (primary) hypertension: Secondary | ICD-10-CM | POA: Diagnosis not present

## 2021-11-06 DIAGNOSIS — Z23 Encounter for immunization: Secondary | ICD-10-CM | POA: Diagnosis not present

## 2021-11-06 DIAGNOSIS — I10 Essential (primary) hypertension: Secondary | ICD-10-CM | POA: Diagnosis not present

## 2021-11-06 DIAGNOSIS — R7303 Prediabetes: Secondary | ICD-10-CM | POA: Diagnosis not present

## 2021-11-06 DIAGNOSIS — E782 Mixed hyperlipidemia: Secondary | ICD-10-CM | POA: Diagnosis not present

## 2021-11-06 DIAGNOSIS — Z0000 Encounter for general adult medical examination without abnormal findings: Secondary | ICD-10-CM | POA: Diagnosis not present

## 2021-11-23 ENCOUNTER — Other Ambulatory Visit: Payer: Self-pay

## 2021-11-23 ENCOUNTER — Ambulatory Visit: Payer: 59 | Admitting: Oncology

## 2021-11-23 ENCOUNTER — Inpatient Hospital Stay: Payer: 59 | Attending: Oncology | Admitting: Nurse Practitioner

## 2021-11-23 DIAGNOSIS — Z853 Personal history of malignant neoplasm of breast: Secondary | ICD-10-CM

## 2021-11-23 DIAGNOSIS — Z08 Encounter for follow-up examination after completed treatment for malignant neoplasm: Secondary | ICD-10-CM

## 2021-11-23 DIAGNOSIS — Z5181 Encounter for therapeutic drug level monitoring: Secondary | ICD-10-CM

## 2021-11-23 DIAGNOSIS — Z79811 Long term (current) use of aromatase inhibitors: Secondary | ICD-10-CM

## 2021-11-23 NOTE — Progress Notes (Signed)
Hematology/Oncology Consult Note [facility]  [phone] [phone]  Virtual Visit Progress Note  I connected with Angela Glass on 11/23/21 at  2:45 PM EST by video enabled telemedicine visit and verified that I am speaking with the correct person using two identifiers.   I discussed the limitations, risks, security and privacy concerns of performing an evaluation and management service by telemedicine and the availability of in-person appointments. I also discussed with the patient that there may be a patient responsible charge related to this service. The patient expressed understanding and agreed to proceed.   Other persons participating in the visit and their role in the encounter: none   Patients location: home  Providers location: clinic   Chief Complaint: Breast Cancer Surveillance  Patient Care Team: Marinda Elk, MD as PCP - General (Physician Assistant)   Name of the patient: Angela Glass  [id number]  Mar 22, 1961   Date of visit: 11/23/21  Diagnosis- pathological prognostic stage I A p T1b PN0CM0 grade 1 invasive lobular carcinoma ER PR positive and HER-2/neu negative status post lumpectomy   Chief complaint/ Reason for visit-routine follow-up of breast cancer  Heme/Onc history: patient is a 61 year old female who has been getting routine screening mammograms and her most recent mammogram in October 2019 showed a possible distortion in the left breast which was biopsied and showed invasive lobular carcinoma grade 1 strongly ER PR positive HER-2/neu negative 6 mm.  Patient also had an MRI which showed non-mass enhancement of 2.8 x 1.5 x 0.8 cm in the upper outer quadrant of the left breast.  No abnormal appearing lymph nodes.   Oncotype score came back at low risk and she did not require adjuvant chemotherapy.  She completed adjuvant radiation treatment and was started on Arimidex in March 2020. Switched to  letrozole in August 2022 d/t joint aches and pains.      Interval history- Patient agrees to telemedicine visit for follow up for breast cancer. She was switched to letrozole in August 2022 d/t joint aches and pains. Pain comes and goes. She is seeing neurology for nerve conduction studies next week. Otherwise feels well. Denies breast lumps, bumps, or pain. Feels well. She continues calcium and vitamin D for osteopenia.    ECOG PS- 1 Pain scale- 0  Review of systems- Review of Systems  Constitutional:  Negative for chills, fever, malaise/fatigue and weight loss.  HENT:  Negative for hearing loss, nosebleeds, sore throat and tinnitus.   Eyes:  Negative for blurred vision and double vision.  Respiratory:  Negative for cough, hemoptysis, shortness of breath and wheezing.   Cardiovascular:  Negative for chest pain, palpitations and leg swelling.  Gastrointestinal:  Negative for abdominal pain, blood in stool, constipation, diarrhea, melena, nausea and vomiting.  Genitourinary:  Negative for dysuria and urgency.  Musculoskeletal:  Negative for back pain, falls, joint pain and myalgias.  Skin:  Negative for itching and rash.  Neurological:  Negative for dizziness, tingling, sensory change, loss of consciousness, weakness and headaches.  Endo/Heme/Allergies:  Negative for environmental allergies. Does not bruise/bleed easily.  Psychiatric/Behavioral:  Negative for depression. The patient is not nervous/anxious and does not have insomnia.      Allergies  Allergen Reactions   Lisinopril-Hydrochlorothiazide Cough     Past Medical History:  Diagnosis Date   Anxiety    Breast cancer ([place]) 07/2018   Invasive lobular carcinoma   Breast cancer of upper-outer quadrant of left female breast ([place]) 09/28/2018  Invasive lobular carcinoma, 2 foci 6 mm and 3 mm, T1b, N0, ER/PR positive, HER-2/neu not overexpressed.  Whole breast radiation   Diabetes mellitus without complication ([place])    GERD  (gastroesophageal reflux disease)    Hyperlipidemia    Hypertension    Personal history of radiation therapy    Left breast     Past Surgical History:  Procedure Laterality Date   BREAST BIOPSY Left 08/17/2018   Affirm bx(ribbon clip), invasive lobular carcinoma   BREAST BIOPSY Left 08/10/2020   affirm bx- coil clip, neg   BREAST LUMPECTOMY Left 09/28/2018   [place]   BREAST LUMPECTOMY WITH NEEDLE LOCALIZATION Left 09/28/2018   Procedure: BREAST LUMPECTOMY WITH NEEDLE LOCALIZATION;  Surgeon: Robert Bellow, MD;  Location: [facility] ORS;  Service: General;  Laterality: Left;   MOUTH SURGERY     DENTAL IMPLANT AND GUM SURGERY   SENTINEL NODE BIOPSY Left 09/28/2018   Procedure: SENTINEL NODE BIOPSY;  Surgeon: Robert Bellow, MD;  Location: [facility] ORS;  Service: General;  Laterality: Left;    Social History   Socioeconomic History   Marital status: Widowed    Spouse name: Not on file   Number of children: Not on file   Years of education: Not on file   Highest education level: Not on file  Occupational History   Not on file  Tobacco Use   Smoking status: Never   Smokeless tobacco: Never  Vaping Use   Vaping Use: Never used  Substance and Sexual Activity   Alcohol use: Yes    Comment: ocassional   Drug use: Never   Sexual activity: Not on file  Other Topics Concern   Not on file  Social History Narrative   Not on file   Social Determinants of Health   Financial Resource Strain: Not on file  Food Insecurity: Not on file  Transportation Needs: Not on file  Physical Activity: Not on file  Stress: Not on file  Social Connections: Not on file  Intimate Partner Violence: Not on file    Family History  Problem Relation Age of Onset   Throat cancer Mother 44   COPD Mother    Healthy Father    Healthy Brother    Lung cancer Maternal Uncle    Lung cancer Paternal Grandfather    Heart attack Brother 40   Lung cancer Maternal Uncle    Breast cancer Neg Hx       Current Outpatient Medications:    albuterol (VENTOLIN HFA) 108 (90 Base) MCG/ACT inhaler, SMARTSIG:2 Inhalation Via Inhaler Every 6 Hours PRN, Disp: , Rfl:    ALPRAZolam (XANAX) 0.5 MG tablet, Take 0.5 mg by mouth daily as needed for anxiety. , Disp: , Rfl:    Calcium Carbonate-Vitamin D 600-200 MG-UNIT TABS, Take 1 tablet by mouth 2 (two) times a day. , Disp: , Rfl:    letrozole (FEMARA) 2.5 MG tablet, TAKE 1 TABLET(2.5 MG) BY MOUTH DAILY, Disp: 30 tablet, Rfl: 1   losartan-hydrochlorothiazide (HYZAAR) 100-12.5 MG tablet, Take 1 tablet by mouth daily. , Disp: , Rfl:    metFORMIN (GLUCOPHAGE) 500 MG tablet, Take 500 mg by mouth daily with breakfast. , Disp: , Rfl:    omeprazole (PRILOSEC) 40 MG capsule, Take 40 mg by mouth daily., Disp: , Rfl: 4   potassium chloride (KLOR-CON) 10 MEQ tablet, TAKE 1 TABLET(10 MEQ) BY MOUTH EVERY DAY, Disp: , Rfl:    rosuvastatin (CRESTOR) 10 MG tablet, Take 10 mg by mouth at bedtime. ,  Disp: , Rfl:    venlafaxine XR (EFFEXOR-XR) 37.5 MG 24 hr capsule, Take 112.5 mg by mouth daily with breakfast. , Disp: , Rfl: 3  Physical exam:  There were no vitals filed for this visit.  Physical Exam Constitutional:      General: She is not in acute distress. HENT:     Head: Normocephalic.  Pulmonary:     Effort: No respiratory distress.  Neurological:     Mental Status: She is alert and oriented to person, place, and time.  Psychiatric:        Mood and Affect: Mood normal.        Behavior: Behavior normal.   CMP Latest Ref Rng & Units 05/18/2019  Glucose 70 - 99 mg/dL 107(H)  BUN 6 - 20 mg/dL 15  Creatinine 0.44 - 1.00 mg/dL 0.83  Sodium 135 - 145 mmol/L 137  Potassium 3.5 - 5.1 mmol/L 3.3(L)  Chloride 98 - 111 mmol/L 100  CO2 22 - 32 mmol/L 26  Calcium 8.9 - 10.3 mg/dL 9.3  Total Protein 6.5 - 8.1 g/dL 7.4  Total Bilirubin 0.3 - 1.2 mg/dL 0.5  Alkaline Phos 38 - 126 U/L 54  AST 15 - 41 U/L 17  ALT 0 - 44 U/L 15   CBC Latest Ref Rng & Units  05/18/2019  WBC 4.0 - 10.5 K/uL 8.7  Hemoglobin 12.0 - 15.0 g/dL 13.4  Hematocrit 36.0 - 46.0 % 40.1  Platelets 150 - 400 K/uL 255     Assessment and plan- Patient is a 61 y.o. female with with   Stage IA Left breast cancer- history of stage Ia ER PR positive HER-2/neu negative invasive lobular carcinoma of the left breast status post lumpectomy and adjuvant radiation treatment. Currently on arimidex since March 2020.  Her last mammogram was October 2022 which normal. Plan to repeat in October 2023. She will continue anti-estrogen treatment for at least 5 years given ER/PR positivity of her cancer.  Osteopenia- 04/05/21 bone density showed t score -1.3. In 2020 T score was -1.5. She continues calcium 1200 mg and vitamin d 1000 iu. Plan to repeat bone density in June 2024.  Hormonal side effects- well controlled on otcs.   6 mo- virtual visit with Dr. Janese Banks  Return to clinic  Visit Diagnosis 1. Encounter for follow-up surveillance of breast cancer   2. Visit for monitoring Arimidex therapy     I discussed the assessment and treatment plan with the patient. The patient was provided an opportunity to ask questions and all were answered. The patient agreed with the plan and demonstrated an understanding of the instructions.   The patient was advised to call back or seek an in-person evaluation if the symptoms worsen or if the condition fails to improve as anticipated.   I spent 20 minutes face-to-face video visit time dedicated to the care of this patient on the date of this encounter to include pre-visit review of mammograms, bone density, med onc notes, face-to-face time with the patient, and post visit ordering of testing/documentation.   Beckey Rutter, DNP, AGNP-C [place] [facility] [phone] (clinic) 11/23/2021

## 2021-12-03 DIAGNOSIS — R200 Anesthesia of skin: Secondary | ICD-10-CM | POA: Diagnosis not present

## 2021-12-11 ENCOUNTER — Other Ambulatory Visit: Payer: Self-pay | Admitting: Oncology

## 2021-12-14 DIAGNOSIS — R200 Anesthesia of skin: Secondary | ICD-10-CM | POA: Insufficient documentation

## 2021-12-26 ENCOUNTER — Other Ambulatory Visit: Payer: Self-pay | Admitting: Oncology

## 2022-01-03 ENCOUNTER — Ambulatory Visit: Payer: Self-pay | Admitting: Radiation Oncology

## 2022-01-10 ENCOUNTER — Ambulatory Visit: Payer: 59 | Attending: Radiation Oncology | Admitting: Radiation Oncology

## 2022-01-25 ENCOUNTER — Other Ambulatory Visit: Payer: Self-pay | Admitting: Oncology

## 2022-01-28 DIAGNOSIS — I10 Essential (primary) hypertension: Secondary | ICD-10-CM | POA: Diagnosis not present

## 2022-01-28 DIAGNOSIS — R7303 Prediabetes: Secondary | ICD-10-CM | POA: Diagnosis not present

## 2022-02-04 DIAGNOSIS — E119 Type 2 diabetes mellitus without complications: Secondary | ICD-10-CM | POA: Diagnosis not present

## 2022-03-26 ENCOUNTER — Other Ambulatory Visit: Payer: Self-pay | Admitting: Oncology

## 2022-05-02 DIAGNOSIS — E119 Type 2 diabetes mellitus without complications: Secondary | ICD-10-CM | POA: Diagnosis not present

## 2022-05-14 DIAGNOSIS — E119 Type 2 diabetes mellitus without complications: Secondary | ICD-10-CM | POA: Diagnosis not present

## 2022-05-14 DIAGNOSIS — I10 Essential (primary) hypertension: Secondary | ICD-10-CM | POA: Diagnosis not present

## 2022-05-14 DIAGNOSIS — E782 Mixed hyperlipidemia: Secondary | ICD-10-CM | POA: Diagnosis not present

## 2022-05-14 DIAGNOSIS — C50412 Malignant neoplasm of upper-outer quadrant of left female breast: Secondary | ICD-10-CM | POA: Diagnosis not present

## 2022-05-24 ENCOUNTER — Encounter: Payer: Self-pay | Admitting: Oncology

## 2022-05-24 ENCOUNTER — Inpatient Hospital Stay: Payer: 59 | Attending: Oncology | Admitting: Oncology

## 2022-05-24 DIAGNOSIS — C50912 Malignant neoplasm of unspecified site of left female breast: Secondary | ICD-10-CM | POA: Diagnosis not present

## 2022-05-26 NOTE — Progress Notes (Signed)
I connected with Angela Glass on 05/26/22 at  2:30 PM EDT by video enabled telemedicine visit and verified that I am speaking with the correct person using two identifiers.   I discussed the limitations, risks, security and privacy concerns of performing an evaluation and management service by telemedicine and the availability of in-person appointments. I also discussed with the patient that there may be a patient responsible charge related to this service. The patient expressed understanding and agreed to proceed.  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  Patient also had an MRI which showed non-mass enhancement of 2.8 x 1.5 x 0.8 cm in the upper outer quadrant of the left breast.  No abnormal appearing lymph nodes.   Oncotype score came back at low risk and she did not require adjuvant chemotherapy.  She completed adjuvant radiation treatment and was started on Arimidex in March 2020.  Interval history Patient is currently on letrozole and tolerating it well.  She did have problems with carpal tunnel syndrome which is being monitored.  Denies any other significant joint pain.   Review of Systems  Constitutional:  Negative for chills, fever, malaise/fatigue and weight loss.  HENT:  Negative for congestion, ear discharge and nosebleeds.   Eyes:  Negative for blurred vision.  Respiratory:  Negative for cough, hemoptysis, sputum production, shortness of breath and wheezing.   Cardiovascular:  Negative for chest pain, palpitations, orthopnea  and claudication.  Gastrointestinal:  Negative for abdominal pain, blood in stool, constipation, diarrhea, heartburn, melena, nausea and vomiting.  Genitourinary:  Negative for dysuria, flank pain, frequency, hematuria and urgency.  Musculoskeletal:  Positive for joint pain (Carpal tunnel). Negative for back pain and myalgias.  Skin:  Negative for rash.  Neurological:  Negative for dizziness, tingling, focal weakness, seizures, weakness and headaches.  Endo/Heme/Allergies:  Does not bruise/bleed easily.  Psychiatric/Behavioral:  Negative for depression and suicidal ideas. The patient does not have insomnia.     Allergies  Allergen Reactions   Lisinopril-Hydrochlorothiazide Cough    Past Medical History:  Diagnosis Date   Anxiety    Breast cancer ([place]) 07/2018   Invasive lobular carcinoma   Breast cancer of upper-outer quadrant of left female breast ([place]) 09/28/2018   Invasive lobular carcinoma, 2 foci 6 mm and 3 mm, T1b, N0, ER/PR positive, HER-2/neu not overexpressed.  Whole breast radiation   Diabetes mellitus without complication (HCC)    GERD (gastroesophageal reflux disease)    Hyperlipidemia    Hypertension    Personal history of radiation therapy    Left breast    Past Surgical History:  Procedure Laterality Date   BREAST BIOPSY Left 08/17/2018   Affirm bx(ribbon clip), invasive lobular carcinoma   BREAST BIOPSY Left 08/10/2020   affirm bx- coil clip, neg   BREAST LUMPECTOMY Left 09/28/2018   [place]   BREAST LUMPECTOMY WITH NEEDLE LOCALIZATION Left 09/28/2018   Procedure: BREAST LUMPECTOMY WITH NEEDLE LOCALIZATION;  Surgeon: Robert Bellow, MD;  Location: [facility] ORS;  Service: General;  Laterality: Left;   MOUTH SURGERY  DENTAL IMPLANT AND GUM SURGERY   SENTINEL NODE BIOPSY Left 09/28/2018   Procedure: SENTINEL NODE BIOPSY;  Surgeon: Robert Bellow, MD;  Location: [facility] ORS;  Service: General;  Laterality: Left;    Social History   Socioeconomic History    Marital status: Widowed    Spouse name: Not on file   Number of children: Not on file   Years of education: Not on file   Highest education level: Not on file  Occupational History   Not on file  Tobacco Use   Smoking status: Never   Smokeless tobacco: Never  Vaping Use   Vaping Use: Never used  Substance and Sexual Activity   Alcohol use: Yes    Comment: ocassional   Drug use: Never   Sexual activity: Not on file  Other Topics Concern   Not on file  Social History Narrative   Not on file   Social Determinants of Health   Financial Resource Strain: Not on file  Food Insecurity: Not on file  Transportation Needs: Not on file  Physical Activity: Not on file  Stress: Not on file  Social Connections: Not on file  Intimate Partner Violence: Not on file    Family History  Problem Relation Age of Onset   Throat cancer Mother 77   COPD Mother    Healthy Father    Healthy Brother    Lung cancer Maternal Uncle    Lung cancer Paternal Grandfather    Heart attack Brother 18   Lung cancer Maternal Uncle    Breast cancer Neg Hx      Current Outpatient Medications:    albuterol (VENTOLIN HFA) 108 (90 Base) MCG/ACT inhaler, SMARTSIG:2 Inhalation Via Inhaler Every 6 Hours PRN, Disp: , Rfl:    ALPRAZolam (XANAX) 0.5 MG tablet, Take 0.5 mg by mouth daily as needed for anxiety. , Disp: , Rfl:    Calcium Carbonate-Vitamin D 600-200 MG-UNIT TABS, Take 1 tablet by mouth 2 (two) times a day. , Disp: , Rfl:    letrozole (FEMARA) 2.5 MG tablet, TAKE ONE TABLET EVERY DAY, Disp: 30 tablet, Rfl: 1   losartan-hydrochlorothiazide (HYZAAR) 100-12.5 MG tablet, Take 1 tablet by mouth daily. , Disp: , Rfl:    metFORMIN (GLUCOPHAGE) 500 MG tablet, Take 500 mg by mouth daily with breakfast. , Disp: , Rfl:    omeprazole (PRILOSEC) 40 MG capsule, Take 40 mg by mouth daily., Disp: , Rfl: 4   potassium chloride (KLOR-CON) 10 MEQ tablet, TAKE 1 TABLET(10 MEQ) BY MOUTH EVERY DAY, Disp: , Rfl:     rosuvastatin (CRESTOR) 10 MG tablet, Take 10 mg by mouth at bedtime. , Disp: , Rfl:    venlafaxine XR (EFFEXOR-XR) 37.5 MG 24 hr capsule, Take 112.5 mg by mouth daily with breakfast. , Disp: , Rfl: 3  No results found.  No images are attached to the encounter.      Latest Ref Rng & Units 05/18/2019   11:08 AM  CMP  Glucose 70 - 99 mg/dL 107   BUN 6 - 20 mg/dL 15   Creatinine 0.44 - 1.00 mg/dL 0.83   Sodium 135 - 145 mmol/L 137   Potassium 3.5 - 5.1 mmol/L 3.3   Chloride 98 - 111 mmol/L 100   CO2 22 - 32 mmol/L 26   Calcium 8.9 - 10.3 mg/dL 9.3   Total Protein 6.5 - 8.1 g/dL 7.4   Total Bilirubin 0.3 - 1.2 mg/dL 0.5   Alkaline Phos 38 - 126 U/L  54   AST 15 - 41 U/L 17   ALT 0 - 44 U/L 15       Latest Ref Rng & Units 05/18/2019   11:08 AM  CBC  WBC 4.0 - 10.5 K/uL 8.7   Hemoglobin 12.0 - 15.0 g/dL 13.4   Hematocrit 36.0 - 46.0 % 40.1   Platelets 150 - 400 K/uL 255      Observation/objective: Appears in no acute distress over video visit today.  Breathing is nonlabored  Assessment and plan: Patient is a 61 year old female with history of stage ILeft breast cancer here for routine follow-up  Clinically patient is doing well and voices no concerning symptoms.  She is tolerating letrozole well without any significant side effects.  Given the significant scarring from radiation mammogram alternating with MRI was recommended.  She had an MRI in June 2022 and a follow-up MRI was recommended in December 2023 which ultimately patient did not get.  I will go ahead and schedule that for December of this year.  Patient is due for her routine screening mammogram in October 2023 which I will schedule as well.  I will see her back in 6 months no labs  Follow-up instructions: As above  I discussed the assessment and treatment plan with the patient. The patient was provided an opportunity to ask questions and all were answered. The patient agreed with the plan and demonstrated an  understanding of the instructions.   The patient was advised to call back or seek an in-person evaluation if the symptoms worsen or if the condition fails to improve as anticipated.  I provided 15 minutes of face-to-face video visit time during this encounter. Time spent in reviewing recent imaging studies and discussing future care plan with the patient  Visit Diagnosis: 1. Invasive lobular carcinoma of breast, stage 1, left ([place])     Dr. Randa Evens, MD, MPH [facility] at [facility] Tel- [phone] 05/26/2022 12:35 PM

## 2022-05-28 ENCOUNTER — Other Ambulatory Visit: Payer: Self-pay | Admitting: Oncology

## 2022-07-30 ENCOUNTER — Other Ambulatory Visit: Payer: Self-pay | Admitting: Oncology

## 2022-08-12 ENCOUNTER — Ambulatory Visit
Admission: RE | Admit: 2022-08-12 | Discharge: 2022-08-12 | Disposition: A | Discharge status: Home / Self Care | Payer: 59 | Source: Ambulatory Visit | Attending: Oncology | Admitting: Oncology

## 2022-08-12 DIAGNOSIS — Z1231 Encounter for screening mammogram for malignant neoplasm of breast: Secondary | ICD-10-CM | POA: Insufficient documentation

## 2022-08-12 DIAGNOSIS — C50912 Malignant neoplasm of unspecified site of left female breast: Secondary | ICD-10-CM | POA: Diagnosis not present

## 2022-09-24 ENCOUNTER — Other Ambulatory Visit: Payer: Self-pay | Admitting: Oncology

## 2022-10-16 ENCOUNTER — Ambulatory Visit: Admission: RE | Admit: 2022-10-16 | Payer: 59 | Source: Ambulatory Visit

## 2022-11-13 DIAGNOSIS — I10 Essential (primary) hypertension: Secondary | ICD-10-CM | POA: Diagnosis not present

## 2022-11-13 DIAGNOSIS — E119 Type 2 diabetes mellitus without complications: Secondary | ICD-10-CM | POA: Diagnosis not present

## 2022-11-13 DIAGNOSIS — E782 Mixed hyperlipidemia: Secondary | ICD-10-CM | POA: Diagnosis not present

## 2022-11-18 ENCOUNTER — Encounter: Payer: Self-pay | Admitting: Oncology

## 2022-11-19 ENCOUNTER — Encounter: Payer: Self-pay | Admitting: *Deleted

## 2022-11-20 DIAGNOSIS — Z0000 Encounter for general adult medical examination without abnormal findings: Secondary | ICD-10-CM | POA: Diagnosis not present

## 2022-11-20 DIAGNOSIS — C50412 Malignant neoplasm of upper-outer quadrant of left female breast: Secondary | ICD-10-CM | POA: Diagnosis not present

## 2022-11-20 DIAGNOSIS — I10 Essential (primary) hypertension: Secondary | ICD-10-CM | POA: Diagnosis not present

## 2022-11-20 DIAGNOSIS — E119 Type 2 diabetes mellitus without complications: Secondary | ICD-10-CM | POA: Diagnosis not present

## 2022-11-20 DIAGNOSIS — E782 Mixed hyperlipidemia: Secondary | ICD-10-CM | POA: Diagnosis not present

## 2022-11-20 DIAGNOSIS — Z23 Encounter for immunization: Secondary | ICD-10-CM | POA: Diagnosis not present

## 2022-11-25 ENCOUNTER — Inpatient Hospital Stay: Payer: 59 | Attending: Oncology | Admitting: Oncology

## 2022-11-25 ENCOUNTER — Encounter: Payer: Self-pay | Admitting: Oncology

## 2022-11-25 VITALS — BP 131/74 | HR 70 | Resp 18 | Ht 60.0 in | Wt 207.0 lb

## 2022-11-25 DIAGNOSIS — Z79811 Long term (current) use of aromatase inhibitors: Secondary | ICD-10-CM | POA: Diagnosis not present

## 2022-11-25 DIAGNOSIS — M810 Age-related osteoporosis without current pathological fracture: Secondary | ICD-10-CM | POA: Insufficient documentation

## 2022-11-25 DIAGNOSIS — Z08 Encounter for follow-up examination after completed treatment for malignant neoplasm: Secondary | ICD-10-CM

## 2022-11-25 DIAGNOSIS — Z780 Asymptomatic menopausal state: Secondary | ICD-10-CM

## 2022-11-25 DIAGNOSIS — Z5181 Encounter for therapeutic drug level monitoring: Secondary | ICD-10-CM | POA: Diagnosis not present

## 2022-11-25 DIAGNOSIS — Z170 Estrogen receptor positive status [ER+]: Secondary | ICD-10-CM | POA: Diagnosis not present

## 2022-11-25 DIAGNOSIS — Z853 Personal history of malignant neoplasm of breast: Secondary | ICD-10-CM

## 2022-11-25 DIAGNOSIS — C50412 Malignant neoplasm of upper-outer quadrant of left female breast: Secondary | ICD-10-CM | POA: Diagnosis not present

## 2022-11-25 NOTE — Progress Notes (Signed)
Hematology/Oncology Consult note [facility]  Telephone:([phone] [phone]  Patient Care Team: Marinda Elk, MD as PCP - General (Physician Assistant)   Name of the patient: Angela Glass  [id number]  Sep 08, 1961   Date of visit: 11/25/22  Diagnosis-history of breast cancer  Chief complaint/ Reason for visit-routine follow-up of breast cancer  Heme/Onc history: patient is a 62 year old female who has been getting routine screening mammograms and her most recent mammogram in October 2019 showed a possible distortion in the left breast which was biopsied and showed invasive lobular carcinoma grade 1 strongly ER PR positive HER-2/neu negative 6 mm.  Patient also had an MRI which showed non-mass enhancement of 2.8 x 1.5 x 0.8 cm in the upper outer quadrant of the left breast.  No abnormal appearing lymph nodes.   Oncotype score came back at low risk and she did not require adjuvant chemotherapy.  She completed adjuvant radiation treatment and was started on Arimidex in March 2020.    Interval history-tolerating Arimidex well without any significant side effects along with calcium and vitamin D.  Denies any acute breast concerns.  ECOG PS- 1 Pain scale- 0   Review of systems- Review of Systems  Constitutional:  Negative for chills, fever, malaise/fatigue and weight loss.  HENT:  Negative for congestion, ear discharge and nosebleeds.   Eyes:  Negative for blurred vision.  Respiratory:  Negative for cough, hemoptysis, sputum production, shortness of breath and wheezing.   Cardiovascular:  Negative for chest pain, palpitations, orthopnea and claudication.  Gastrointestinal:  Negative for abdominal pain, blood in stool, constipation, diarrhea, heartburn, melena, nausea and vomiting.  Genitourinary:  Negative for dysuria, flank pain, frequency, hematuria and urgency.  Musculoskeletal:  Negative for back pain, joint pain and myalgias.  Skin:   Negative for rash.  Neurological:  Negative for dizziness, tingling, focal weakness, seizures, weakness and headaches.  Endo/Heme/Allergies:  Does not bruise/bleed easily.  Psychiatric/Behavioral:  Negative for depression and suicidal ideas. The patient does not have insomnia.       Allergies  Allergen Reactions   Lisinopril-Hydrochlorothiazide Cough     Past Medical History:  Diagnosis Date   Anxiety    Breast cancer ([place]) 07/2018   Invasive lobular carcinoma   Breast cancer of upper-outer quadrant of left female breast ([place]) 09/28/2018   Invasive lobular carcinoma, 2 foci 6 mm and 3 mm, T1b, N0, ER/PR positive, HER-2/neu not overexpressed.  Whole breast radiation   Diabetes mellitus without complication ([place])    GERD (gastroesophageal reflux disease)    Hyperlipidemia    Hypertension    Personal history of radiation therapy    Left breast     Past Surgical History:  Procedure Laterality Date   BREAST BIOPSY Left 08/17/2018   Affirm bx(ribbon clip), invasive lobular carcinoma   BREAST BIOPSY Left 08/10/2020   affirm bx- coil clip, neg   BREAST LUMPECTOMY Left 09/28/2018   [place]   BREAST LUMPECTOMY WITH NEEDLE LOCALIZATION Left 09/28/2018   Procedure: BREAST LUMPECTOMY WITH NEEDLE LOCALIZATION;  Surgeon: Robert Bellow, MD;  Location: [facility] ORS;  Service: General;  Laterality: Left;   MOUTH SURGERY     DENTAL IMPLANT AND GUM SURGERY   SENTINEL NODE BIOPSY Left 09/28/2018   Procedure: SENTINEL NODE BIOPSY;  Surgeon: Robert Bellow, MD;  Location: [facility] ORS;  Service: General;  Laterality: Left;    Social History   Socioeconomic History   Marital status: Widowed    Spouse name:  Not on file   Number of children: Not on file   Years of education: Not on file   Highest education level: Not on file  Occupational History   Not on file  Tobacco Use   Smoking status: Never   Smokeless tobacco: Never  Vaping Use   Vaping Use: Never used  Substance and Sexual  Activity   Alcohol use: Yes    Comment: ocassional   Drug use: Never   Sexual activity: Not on file  Other Topics Concern   Not on file  Social History Narrative   Not on file   Social Determinants of Health   Financial Resource Strain: Not on file  Food Insecurity: Not on file  Transportation Needs: Not on file  Physical Activity: Not on file  Stress: Not on file  Social Connections: Not on file  Intimate Partner Violence: Not on file    Family History  Problem Relation Age of Onset   Throat cancer Mother 71   COPD Mother    Healthy Father    Healthy Brother    Lung cancer Maternal Uncle    Lung cancer Paternal Grandfather    Heart attack Brother 39   Lung cancer Maternal Uncle    Breast cancer Neg Hx      Current Outpatient Medications:    albuterol (VENTOLIN HFA) 108 (90 Base) MCG/ACT inhaler, SMARTSIG:2 Inhalation Via Inhaler Every 6 Hours PRN, Disp: , Rfl:    ALPRAZolam (XANAX) 0.5 MG tablet, Take 0.5 mg by mouth daily as needed for anxiety. , Disp: , Rfl:    Calcium Carbonate-Vitamin D 600-200 MG-UNIT TABS, Take 1 tablet by mouth 2 (two) times a day. , Disp: , Rfl:    hydrocortisone (ANUSOL-HC) 2.5 % rectal cream, Apply topically., Disp: , Rfl:    letrozole (FEMARA) 2.5 MG tablet, TAKE 1 TABLET BY MOUTH DAILY, Disp: 30 tablet, Rfl: 1   losartan-hydrochlorothiazide (HYZAAR) 100-12.5 MG tablet, Take 1 tablet by mouth daily. , Disp: , Rfl:    omeprazole (PRILOSEC) 40 MG capsule, Take 40 mg by mouth daily., Disp: , Rfl: 4   potassium chloride (KLOR-CON) 10 MEQ tablet, TAKE 1 TABLET(10 MEQ) BY MOUTH EVERY DAY, Disp: , Rfl:    rosuvastatin (CRESTOR) 10 MG tablet, Take 10 mg by mouth at bedtime. , Disp: , Rfl:    TRULICITY 3 FI/4.3PI SOPN, INJECT 3MG  SQ EVERY 7 DAYS, Disp: , Rfl:    venlafaxine XR (EFFEXOR-XR) 37.5 MG 24 hr capsule, Take 112.5 mg by mouth daily with breakfast. , Disp: , Rfl: 3   metFORMIN (GLUCOPHAGE) 500 MG tablet, Take 500 mg by mouth daily with  breakfast. , Disp: , Rfl:   Physical exam:  Vitals:   11/25/22 1307 11/25/22 1309  BP:  131/74  Pulse:  70  Resp:  18  SpO2:  99%  Weight: 207 lb (93.9 kg)   Height:  5' (1.524 m)   Physical Exam Cardiovascular:     Rate and Rhythm: Normal rate and regular rhythm.     Heart sounds: Normal heart sounds.  Pulmonary:     Effort: Pulmonary effort is normal.     Breath sounds: Normal breath sounds.  Skin:    General: Skin is warm and dry.  Neurological:     Mental Status: She is alert and oriented to person, place, and time.   Breast exam: No palpable masses in the right breast.  No palpable bilateral axillary adenopathy.  There is significant skin thickening and induration  from prior radiation and surgery noted in the left breast which is chronic.  Breast exam is therefore limited.  Grossly no palpable masses in the left breast     Latest Ref Rng & Units 05/18/2019   11:08 AM  CMP  Glucose 70 - 99 mg/dL 107   BUN 6 - 20 mg/dL 15   Creatinine 0.44 - 1.00 mg/dL 0.83   Sodium 135 - 145 mmol/L 137   Potassium 3.5 - 5.1 mmol/L 3.3   Chloride 98 - 111 mmol/L 100   CO2 22 - 32 mmol/L 26   Calcium 8.9 - 10.3 mg/dL 9.3   Total Protein 6.5 - 8.1 g/dL 7.4   Total Bilirubin 0.3 - 1.2 mg/dL 0.5   Alkaline Phos 38 - 126 U/L 54   AST 15 - 41 U/L 17   ALT 0 - 44 U/L 15       Latest Ref Rng & Units 05/18/2019   11:08 AM  CBC  WBC 4.0 - 10.5 K/uL 8.7   Hemoglobin 12.0 - 15.0 g/dL 13.4   Hematocrit 36.0 - 46.0 % 40.1   Platelets 150 - 400 K/uL 255      Assessment and plan- Patient is a 62 y.o. female with history of stage I left breast cancer currently on Arimidex.  This is a routine follow-up visit  Clinically patient is doing well with no concerning signs and symptoms of recurrence based on today's exam.  She will be completing 4 years of Arimidex soon.  She will be due for a bone density scan in June 2024 which I will schedule.  Baseline bone density scan showed osteopenia but was  not bad enough to warrant bisphosphonates.  Patient does not wish to get any future MRIs and we will therefore continue with yearly mammograms every October   Visit Diagnosis 1. Encounter for follow-up surveillance of breast cancer   2. Osteoporosis, unspecified osteoporosis type, unspecified pathological fracture presence   3. Post-menopausal      Dr. Randa Evens, MD, MPH [facility] at [facility] [phone] 11/25/2022 2:41 PM

## 2022-11-27 ENCOUNTER — Other Ambulatory Visit: Payer: Self-pay | Admitting: Oncology

## 2023-03-27 DIAGNOSIS — M19031 Primary osteoarthritis, right wrist: Secondary | ICD-10-CM | POA: Diagnosis not present

## 2023-03-27 DIAGNOSIS — M064 Inflammatory polyarthropathy: Secondary | ICD-10-CM | POA: Diagnosis not present

## 2023-03-27 DIAGNOSIS — M7989 Other specified soft tissue disorders: Secondary | ICD-10-CM | POA: Diagnosis not present

## 2023-03-27 DIAGNOSIS — Z111 Encounter for screening for respiratory tuberculosis: Secondary | ICD-10-CM | POA: Diagnosis not present

## 2023-03-27 DIAGNOSIS — Z7960 Long term (current) use of unspecified immunomodulators and immunosuppressants: Secondary | ICD-10-CM | POA: Diagnosis not present

## 2023-03-27 DIAGNOSIS — M19032 Primary osteoarthritis, left wrist: Secondary | ICD-10-CM | POA: Diagnosis not present

## 2023-05-12 DIAGNOSIS — I10 Essential (primary) hypertension: Secondary | ICD-10-CM | POA: Diagnosis not present

## 2023-05-12 DIAGNOSIS — E119 Type 2 diabetes mellitus without complications: Secondary | ICD-10-CM | POA: Diagnosis not present

## 2023-05-12 DIAGNOSIS — E782 Mixed hyperlipidemia: Secondary | ICD-10-CM | POA: Diagnosis not present

## 2023-05-20 DIAGNOSIS — Z7960 Long term (current) use of unspecified immunomodulators and immunosuppressants: Secondary | ICD-10-CM | POA: Diagnosis not present

## 2023-05-20 DIAGNOSIS — R7989 Other specified abnormal findings of blood chemistry: Secondary | ICD-10-CM | POA: Diagnosis not present

## 2023-05-20 DIAGNOSIS — M0579 Rheumatoid arthritis with rheumatoid factor of multiple sites without organ or systems involvement: Secondary | ICD-10-CM | POA: Diagnosis not present

## 2023-05-26 ENCOUNTER — Inpatient Hospital Stay: Payer: 59 | Attending: Oncology | Admitting: Oncology

## 2023-05-26 ENCOUNTER — Encounter: Payer: Self-pay | Admitting: Oncology

## 2023-05-26 VITALS — BP 136/86 | HR 77 | Temp 97.1°F | Resp 18 | Ht 61.0 in | Wt 253.8 lb

## 2023-05-26 DIAGNOSIS — Z79811 Long term (current) use of aromatase inhibitors: Secondary | ICD-10-CM | POA: Diagnosis not present

## 2023-05-26 DIAGNOSIS — Z170 Estrogen receptor positive status [ER+]: Secondary | ICD-10-CM | POA: Diagnosis not present

## 2023-05-26 DIAGNOSIS — Z08 Encounter for follow-up examination after completed treatment for malignant neoplasm: Secondary | ICD-10-CM | POA: Diagnosis not present

## 2023-05-26 DIAGNOSIS — C50412 Malignant neoplasm of upper-outer quadrant of left female breast: Secondary | ICD-10-CM | POA: Insufficient documentation

## 2023-05-26 DIAGNOSIS — M069 Rheumatoid arthritis, unspecified: Secondary | ICD-10-CM | POA: Insufficient documentation

## 2023-05-26 DIAGNOSIS — M85851 Other specified disorders of bone density and structure, right thigh: Secondary | ICD-10-CM | POA: Diagnosis not present

## 2023-05-26 DIAGNOSIS — Z5181 Encounter for therapeutic drug level monitoring: Secondary | ICD-10-CM

## 2023-05-26 DIAGNOSIS — Z853 Personal history of malignant neoplasm of breast: Secondary | ICD-10-CM

## 2023-05-26 DIAGNOSIS — Z79899 Other long term (current) drug therapy: Secondary | ICD-10-CM | POA: Diagnosis not present

## 2023-05-26 NOTE — Progress Notes (Signed)
Hematology/Oncology Consult note [facility]  Telephone:([phone] [phone]  Patient Care Team: Patrice Paradise, MD as PCP - General (Physician Assistant)   Name of the patient: Angela Glass  [id number]  1960-11-10   Date of visit: 05/26/23  Diagnosis-history of breast cancer  Chief complaint/ Reason for visit-routine follow-up of breast cancer  Heme/Onc history: patient is a 62 year old female who has been getting routine screening mammograms and her most recent mammogram in October 2019 showed a possible distortion in the left breast which was biopsied and showed invasive lobular carcinoma grade 1 strongly ER PR positive HER-2/neu negative 6 mm.  Patient also had an MRI which showed non-mass enhancement of 2.8 x 1.5 x 0.8 cm in the upper outer quadrant of the left breast.  No abnormal appearing lymph nodes.   Oncotype score came back at low risk and she did not require adjuvant chemotherapy.  She completed adjuvant radiation treatment and was started on Arimidex in March 2020.   Interval history-tolerating Arimidex well without any significant sideEffects.  Reports no new breast concerns.  She was diagnosed with rheumatoid arthritis and is currently on methotrexate.  ECOG PS- 1 Pain scale- 4 Opioid associated constipation- no  Review of systems- Review of Systems  Constitutional:  Negative for chills, fever, malaise/fatigue and weight loss.  HENT:  Negative for congestion, ear discharge and nosebleeds.   Eyes:  Negative for blurred vision.  Respiratory:  Negative for cough, hemoptysis, sputum production, shortness of breath and wheezing.   Cardiovascular:  Negative for chest pain, palpitations, orthopnea and claudication.  Gastrointestinal:  Negative for abdominal pain, blood in stool, constipation, diarrhea, heartburn, melena, nausea and vomiting.  Genitourinary:  Negative for dysuria, flank pain, frequency, hematuria and urgency.   Musculoskeletal:  Positive for joint pain. Negative for back pain and myalgias.  Skin:  Negative for rash.  Neurological:  Negative for dizziness, tingling, focal weakness, seizures, weakness and headaches.  Endo/Heme/Allergies:  Does not bruise/bleed easily.  Psychiatric/Behavioral:  Negative for depression and suicidal ideas. The patient does not have insomnia.       Allergies  Allergen Reactions   Lisinopril-Hydrochlorothiazide Cough     Past Medical History:  Diagnosis Date   Anxiety    Breast cancer (HCC) 07/2018   Invasive lobular carcinoma   Breast cancer of upper-outer quadrant of left female breast (HCC) 09/28/2018   Invasive lobular carcinoma, 2 foci 6 mm and 3 mm, T1b, N0, ER/PR positive, HER-2/neu not overexpressed.  Whole breast radiation   Diabetes mellitus without complication (HCC)    GERD (gastroesophageal reflux disease)    Hyperlipidemia    Hypertension    Personal history of radiation therapy    Left breast     Past Surgical History:  Procedure Laterality Date   BREAST BIOPSY Left 08/17/2018   Affirm bx(ribbon clip), invasive lobular carcinoma   BREAST BIOPSY Left 08/10/2020   affirm bx- coil clip, neg   BREAST LUMPECTOMY Left 09/28/2018   ILC   BREAST LUMPECTOMY WITH NEEDLE LOCALIZATION Left 09/28/2018   Procedure: BREAST LUMPECTOMY WITH NEEDLE LOCALIZATION;  Surgeon: Earline Mayotte, MD;  Location: [facility] ORS;  Service: General;  Laterality: Left;   MOUTH SURGERY     DENTAL IMPLANT AND GUM SURGERY   SENTINEL NODE BIOPSY Left 09/28/2018   Procedure: SENTINEL NODE BIOPSY;  Surgeon: Earline Mayotte, MD;  Location: [facility] ORS;  Service: General;  Laterality: Left;    Social History   Socioeconomic History  Marital status: Widowed    Spouse name: Not on file   Number of children: Not on file   Years of education: Not on file   Highest education level: Not on file  Occupational History   Not on file  Tobacco Use   Smoking status: Never    Smokeless tobacco: Never  Vaping Use   Vaping status: Never Used  Substance and Sexual Activity   Alcohol use: Yes    Comment: ocassional   Drug use: Never   Sexual activity: Not on file  Other Topics Concern   Not on file  Social History Narrative   Not on file   Social Determinants of Health   Financial Resource Strain: Not on file  Food Insecurity: Not on file  Transportation Needs: Not on file  Physical Activity: Not on file  Stress: Not on file  Social Connections: Not on file  Intimate Partner Violence: Not on file    Family History  Problem Relation Age of Onset   Throat cancer Mother 63   COPD Mother    Healthy Father    Healthy Brother    Lung cancer Maternal Uncle    Lung cancer Paternal Grandfather    Heart attack Brother 48   Lung cancer Maternal Uncle    Breast cancer Neg Hx      Current Outpatient Medications:    albuterol (VENTOLIN HFA) 108 (90 Base) MCG/ACT inhaler, SMARTSIG:2 Inhalation Via Inhaler Every 6 Hours PRN, Disp: , Rfl:    ALPRAZolam (XANAX) 0.5 MG tablet, Take 0.5 mg by mouth daily as needed for anxiety. , Disp: , Rfl:    Calcium Carbonate-Vitamin D 600-200 MG-UNIT TABS, Take 1 tablet by mouth 2 (two) times a day. , Disp: , Rfl:    letrozole (FEMARA) 2.5 MG tablet, TAKE 1 TABLET BY MOUTH DAILY, Disp: 90 tablet, Rfl: 1   losartan-hydrochlorothiazide (HYZAAR) 100-12.5 MG tablet, Take 1 tablet by mouth daily. , Disp: , Rfl:    metFORMIN (GLUCOPHAGE) 500 MG tablet, Take 500 mg by mouth daily with breakfast. , Disp: , Rfl:    omeprazole (PRILOSEC) 40 MG capsule, Take 40 mg by mouth daily., Disp: , Rfl: 4   potassium chloride (KLOR-CON) 10 MEQ tablet, TAKE 1 TABLET(10 MEQ) BY MOUTH EVERY DAY, Disp: , Rfl:    rosuvastatin (CRESTOR) 10 MG tablet, Take 10 mg by mouth at bedtime. , Disp: , Rfl:    TRULICITY 3 MG/0.5ML SOPN, INJECT 3MG  SQ EVERY 7 DAYS, Disp: , Rfl:    venlafaxine XR (EFFEXOR-XR) 37.5 MG 24 hr capsule, Take 112.5 mg by mouth daily with  breakfast. , Disp: , Rfl: 3  Physical exam:  Vitals:   05/26/23 1306  BP: 136/86  Pulse: 77  Resp: 18  Temp: (!) 97.1 F (36.2 C)  TempSrc: Tympanic  SpO2: 97%  Weight: 253 lb 12.8 oz (115.1 kg)  Height: 5\' 1"  (1.549 m)   Physical Exam Cardiovascular:     Rate and Rhythm: Normal rate and regular rhythm.     Heart sounds: Normal heart sounds.  Pulmonary:     Effort: Pulmonary effort is normal.     Breath sounds: Normal breath sounds.  Skin:    General: Skin is warm and dry.  Neurological:     Mental Status: She is alert and oriented to person, place, and time.    Breast exam was performed in seated and lying down position. Patient is status post left lumpectomy with a well-healed surgical scar.  There  is significant fibrosis involving the left breast following radiation which is chronic.  No evidence of any palpable masses. No evidence of axillary adenopathy. No evidence of any palpable masses or lumps in the right breast. No evidence of right axillary adenopathy      Latest Ref Rng & Units 05/18/2019   11:08 AM  CMP  Glucose 70 - 99 mg/dL 284   BUN 6 - 20 mg/dL 15   Creatinine 1.32 - 1.00 mg/dL 4.40   Sodium 102 - 725 mmol/L 137   Potassium 3.5 - 5.1 mmol/L 3.3   Chloride 98 - 111 mmol/L 100   CO2 22 - 32 mmol/L 26   Calcium 8.9 - 10.3 mg/dL 9.3   Total Protein 6.5 - 8.1 g/dL 7.4   Total Bilirubin 0.3 - 1.2 mg/dL 0.5   Alkaline Phos 38 - 126 U/L 54   AST 15 - 41 U/L 17   ALT 0 - 44 U/L 15       Latest Ref Rng & Units 05/18/2019   11:08 AM  CBC  WBC 4.0 - 10.5 K/uL 8.7   Hemoglobin 12.0 - 15.0 g/dL 36.6   Hematocrit 44.0 - 46.0 % 40.1   Platelets 150 - 400 K/uL 255     No images are attached to the encounter.  No results found.   Assessment and plan- Patient is a 62 y.o. female with history of stage I left breast cancer on Arimidex and this is a routine follow-up visit  Clinically patient is doing well with no concerning signs and symptoms of recurrence  based on today's exam.  I will schedule her mammogram for October 2024.  Patient will be completing 5 years of Arimidex in March 2025.  She is overdue for bone density scan which I will schedule at this time.   Visit Diagnosis 1. Encounter for follow-up surveillance of breast cancer   2. Osteopenia of neck of right femur   3. Visit for monitoring Arimidex therapy      Dr. Owens Shark, MD, MPH [facility] [phone] 05/26/2023 4:01 PM

## 2023-06-11 DIAGNOSIS — E119 Type 2 diabetes mellitus without complications: Secondary | ICD-10-CM | POA: Diagnosis not present

## 2023-06-11 DIAGNOSIS — I10 Essential (primary) hypertension: Secondary | ICD-10-CM | POA: Diagnosis not present

## 2023-06-11 DIAGNOSIS — M0579 Rheumatoid arthritis with rheumatoid factor of multiple sites without organ or systems involvement: Secondary | ICD-10-CM | POA: Diagnosis not present

## 2023-06-11 DIAGNOSIS — E669 Obesity, unspecified: Secondary | ICD-10-CM | POA: Diagnosis not present

## 2023-06-11 DIAGNOSIS — C50912 Malignant neoplasm of unspecified site of left female breast: Secondary | ICD-10-CM | POA: Diagnosis not present

## 2023-06-11 DIAGNOSIS — E782 Mixed hyperlipidemia: Secondary | ICD-10-CM | POA: Diagnosis not present

## 2023-07-03 DIAGNOSIS — M0579 Rheumatoid arthritis with rheumatoid factor of multiple sites without organ or systems involvement: Secondary | ICD-10-CM | POA: Diagnosis not present

## 2023-07-16 ENCOUNTER — Other Ambulatory Visit: Payer: Self-pay | Admitting: Oncology

## 2023-08-14 ENCOUNTER — Ambulatory Visit
Admission: RE | Admit: 2023-08-14 | Discharge: 2023-08-14 | Disposition: A | Discharge status: Home / Self Care | Payer: Self-pay | Source: Ambulatory Visit | Attending: Oncology | Admitting: Oncology

## 2023-08-14 DIAGNOSIS — M85851 Other specified disorders of bone density and structure, right thigh: Secondary | ICD-10-CM | POA: Diagnosis not present

## 2023-08-14 DIAGNOSIS — Z853 Personal history of malignant neoplasm of breast: Secondary | ICD-10-CM | POA: Insufficient documentation

## 2023-08-14 DIAGNOSIS — M0579 Rheumatoid arthritis with rheumatoid factor of multiple sites without organ or systems involvement: Secondary | ICD-10-CM | POA: Diagnosis not present

## 2023-08-14 DIAGNOSIS — Z08 Encounter for follow-up examination after completed treatment for malignant neoplasm: Secondary | ICD-10-CM | POA: Insufficient documentation

## 2023-08-14 DIAGNOSIS — Z1231 Encounter for screening mammogram for malignant neoplasm of breast: Secondary | ICD-10-CM | POA: Diagnosis not present

## 2023-11-24 ENCOUNTER — Telehealth: Payer: Self-pay | Admitting: Oncology

## 2023-11-24 NOTE — Telephone Encounter (Signed)
Patient left voicemail to reschedule appointment to after 2/5 due to insurance becoming effective. Appointment rescheduled and voicemail left with new appointment day and time

## 2023-11-26 ENCOUNTER — Inpatient Hospital Stay: Payer: Self-pay | Admitting: Oncology

## 2023-12-03 DIAGNOSIS — Z0000 Encounter for general adult medical examination without abnormal findings: Secondary | ICD-10-CM | POA: Diagnosis not present

## 2023-12-03 DIAGNOSIS — E782 Mixed hyperlipidemia: Secondary | ICD-10-CM | POA: Diagnosis not present

## 2023-12-03 DIAGNOSIS — E119 Type 2 diabetes mellitus without complications: Secondary | ICD-10-CM | POA: Diagnosis not present

## 2023-12-03 DIAGNOSIS — I10 Essential (primary) hypertension: Secondary | ICD-10-CM | POA: Diagnosis not present

## 2023-12-09 ENCOUNTER — Encounter: Payer: Self-pay | Admitting: Oncology

## 2023-12-09 ENCOUNTER — Inpatient Hospital Stay: Payer: 59 | Attending: Oncology | Admitting: Oncology

## 2023-12-09 VITALS — BP 157/66 | HR 66 | Temp 98.6°F | Resp 19 | Wt 218.6 lb

## 2023-12-09 DIAGNOSIS — Z170 Estrogen receptor positive status [ER+]: Secondary | ICD-10-CM | POA: Insufficient documentation

## 2023-12-09 DIAGNOSIS — Z79899 Other long term (current) drug therapy: Secondary | ICD-10-CM | POA: Diagnosis not present

## 2023-12-09 DIAGNOSIS — Z923 Personal history of irradiation: Secondary | ICD-10-CM | POA: Diagnosis not present

## 2023-12-09 DIAGNOSIS — Z79811 Long term (current) use of aromatase inhibitors: Secondary | ICD-10-CM | POA: Diagnosis not present

## 2023-12-09 DIAGNOSIS — Z1722 Progesterone receptor negative status: Secondary | ICD-10-CM | POA: Insufficient documentation

## 2023-12-09 DIAGNOSIS — Z08 Encounter for follow-up examination after completed treatment for malignant neoplasm: Secondary | ICD-10-CM | POA: Diagnosis not present

## 2023-12-09 DIAGNOSIS — Z853 Personal history of malignant neoplasm of breast: Secondary | ICD-10-CM

## 2023-12-09 DIAGNOSIS — Z1732 Human epidermal growth factor receptor 2 negative status: Secondary | ICD-10-CM | POA: Diagnosis not present

## 2023-12-09 DIAGNOSIS — C50412 Malignant neoplasm of upper-outer quadrant of left female breast: Secondary | ICD-10-CM | POA: Diagnosis not present

## 2023-12-09 NOTE — Progress Notes (Signed)
Hematology/Oncology Consult note [facility]  Telephone:([phone] [phone]  Patient Care Team: Patrice Paradise, MD as PCP - General (Physician Assistant) Creig Hines, MD as Consulting Physician (Oncology)   Name of the patient: Angela Glass  [id number]  December 18, 1960   Date of visit: 12/09/23  Diagnosis-history of stage I left breast cancer  Chief complaint/ Reason for visit-routine follow-up of breast cancer  Heme/Onc history: patient is a 63 year old female who has been getting routine screening mammograms and her most recent mammogram in October 2019 showed a possible distortion in the left breast which was biopsied and showed invasive lobular carcinoma grade 1 strongly ER PR positive HER-2/neu negative 6 mm.  Patient also had an MRI which showed non-mass enhancement of 2.8 x 1.5 x 0.8 cm in the upper outer quadrant of the left breast.  No abnormal appearing lymph nodes.   Oncotype score came back at low risk and she did not require adjuvant chemotherapy.  She completed adjuvant radiation treatment and was started on Arimidex in March 2020.  Subsequently she was switched to letrozole    Interval history-patient is currently doing well and is tolerating letrozole without any significant side effects.  She is also taking her calcium and vitamin D.  Appetite and weight have remained stable.  She does report increasing joint pains and will be seen by her pcp soon.   ECOG PS- 1 Pain scale- 0 Opioid associated constipation- no  Review of systems- Review of Systems  Constitutional:  Negative for chills, fever, malaise/fatigue and weight loss.  HENT:  Negative for congestion, ear discharge and nosebleeds.   Eyes:  Negative for blurred vision.  Respiratory:  Negative for cough, hemoptysis, sputum production, shortness of breath and wheezing.   Cardiovascular:  Negative for chest pain, palpitations, orthopnea and claudication.   Gastrointestinal:  Negative for abdominal pain, blood in stool, constipation, diarrhea, heartburn, melena, nausea and vomiting.  Genitourinary:  Negative for dysuria, flank pain, frequency, hematuria and urgency.  Musculoskeletal:  Negative for back pain, joint pain and myalgias.  Skin:  Negative for rash.  Neurological:  Negative for dizziness, tingling, focal weakness, seizures, weakness and headaches.  Endo/Heme/Allergies:  Does not bruise/bleed easily.  Psychiatric/Behavioral:  Negative for depression and suicidal ideas. The patient does not have insomnia.       Allergies  Allergen Reactions   Lisinopril-Hydrochlorothiazide Cough     Past Medical History:  Diagnosis Date   Anxiety    Breast cancer (HCC) 07/2018   Invasive lobular carcinoma   Breast cancer of upper-outer quadrant of left female breast (HCC) 09/28/2018   Invasive lobular carcinoma, 2 foci 6 mm and 3 mm, T1b, N0, ER/PR positive, HER-2/neu not overexpressed.  Whole breast radiation   Diabetes mellitus without complication (HCC)    GERD (gastroesophageal reflux disease)    Hyperlipidemia    Hypertension    Personal history of radiation therapy    Left breast     Past Surgical History:  Procedure Laterality Date   BREAST BIOPSY Left 08/17/2018   Affirm bx(ribbon clip), invasive lobular carcinoma   BREAST BIOPSY Left 08/10/2020   affirm bx- coil clip, neg   BREAST LUMPECTOMY Left 09/28/2018   ILC   BREAST LUMPECTOMY WITH NEEDLE LOCALIZATION Left 09/28/2018   Procedure: BREAST LUMPECTOMY WITH NEEDLE LOCALIZATION;  Surgeon: Earline Mayotte, MD;  Location: [facility] ORS;  Service: General;  Laterality: Left;   MOUTH SURGERY     DENTAL IMPLANT AND GUM SURGERY  SENTINEL NODE BIOPSY Left 09/28/2018   Procedure: SENTINEL NODE BIOPSY;  Surgeon: Earline Mayotte, MD;  Location: [facility] ORS;  Service: General;  Laterality: Left;    Social History   Socioeconomic History   Marital status: Widowed    Spouse name:  Not on file   Number of children: Not on file   Years of education: Not on file   Highest education level: Not on file  Occupational History   Not on file  Tobacco Use   Smoking status: Never   Smokeless tobacco: Never  Vaping Use   Vaping status: Never Used  Substance and Sexual Activity   Alcohol use: Yes    Comment: ocassional   Drug use: Never   Sexual activity: Not on file  Other Topics Concern   Not on file  Social History Narrative   Not on file   Social Drivers of Health   Financial Resource Strain: Low Risk  (06/11/2023)   Received from [facility]   Overall Financial Resource Strain (CARDIA)    Difficulty of Paying Living Expenses: Not hard at all  Food Insecurity: No Food Insecurity (06/11/2023)   Received from [facility]   Hunger Vital Sign    Worried About Running Out of Food in the Last Year: Never true    Ran Out of Food in the Last Year: Never true  Transportation Needs: No Transportation Needs (06/11/2023)   Received from [facility] - Transportation    In the past 12 months, has lack of transportation kept you from medical appointments or from getting medications?: No    Lack of Transportation (Non-Medical): No  Physical Activity: Not on file  Stress: Not on file  Social Connections: Not on file  Intimate Partner Violence: Not on file    Family History  Problem Relation Age of Onset   Throat cancer Mother 11   COPD Mother    Healthy Father    Healthy Brother    Lung cancer Maternal Uncle    Lung cancer Paternal Grandfather    Heart attack Brother 42   Lung cancer Maternal Uncle    Breast cancer Neg Hx      Current Outpatient Medications:    albuterol (VENTOLIN HFA) 108 (90 Base) MCG/ACT inhaler, SMARTSIG:2 Inhalation Via Inhaler Every 6 Hours PRN, Disp: , Rfl:    ALPRAZolam (XANAX) 0.5 MG tablet, Take 0.5 mg by mouth daily as needed for anxiety. , Disp: , Rfl:    Calcium  Carbonate-Vitamin D 600-200 MG-UNIT TABS, Take 1 tablet by mouth 2 (two) times a day. , Disp: , Rfl:    letrozole (FEMARA) 2.5 MG tablet, TAKE 1 TABLET BY MOUTH DAILY, Disp: 90 tablet, Rfl: 1   losartan-hydrochlorothiazide (HYZAAR) 100-12.5 MG tablet, Take 1 tablet by mouth daily. , Disp: , Rfl:    metFORMIN (GLUCOPHAGE) 500 MG tablet, Take 500 mg by mouth daily with breakfast. , Disp: , Rfl:    omeprazole (PRILOSEC) 40 MG capsule, Take 40 mg by mouth daily., Disp: , Rfl: 4   potassium chloride (KLOR-CON) 10 MEQ tablet, TAKE 1 TABLET(10 MEQ) BY MOUTH EVERY DAY, Disp: , Rfl:    rosuvastatin (CRESTOR) 10 MG tablet, Take 10 mg by mouth at bedtime. , Disp: , Rfl:    TRULICITY 3 MG/0.5ML SOPN, INJECT 3MG  SQ EVERY 7 DAYS, Disp: , Rfl:    venlafaxine XR (EFFEXOR-XR) 37.5 MG 24 hr capsule, Take 112.5 mg by mouth daily with  breakfast. , Disp: , Rfl: 3  Physical exam:  Vitals:   12/09/23 1005  BP: (!) 157/66  Pulse: 66  Resp: 19  Temp: 98.6 F (37 C)  SpO2: 99%  Weight: 218 lb 9.6 oz (99.2 kg)   Physical Exam Cardiovascular:     Rate and Rhythm: Normal rate and regular rhythm.     Heart sounds: Normal heart sounds.  Pulmonary:     Effort: Pulmonary effort is normal.     Breath sounds: Normal breath sounds.  Skin:    General: Skin is warm and dry.  Neurological:     Mental Status: She is alert and oriented to person, place, and time.    Breast exam was performed in seated and lying down position. Patient is status post left lumpectomy with a well-healed surgical scar. No evidence of any palpable masses.  Exam however limited due to radiation fibrosis involving the left breast.  No evidence of axillary adenopathy. No evidence of any palpable masses or lumps in the right breast. No evidence of right axillary adenopathy      Latest Ref Rng & Units 05/18/2019   11:08 AM  CMP  Glucose 70 - 99 mg/dL 664   BUN 6 - 20 mg/dL 15   Creatinine 4.03 - 1.00 mg/dL 4.74   Sodium 259 - 563 mmol/L 137    Potassium 3.5 - 5.1 mmol/L 3.3   Chloride 98 - 111 mmol/L 100   CO2 22 - 32 mmol/L 26   Calcium 8.9 - 10.3 mg/dL 9.3   Total Protein 6.5 - 8.1 g/dL 7.4   Total Bilirubin 0.3 - 1.2 mg/dL 0.5   Alkaline Phos 38 - 126 U/L 54   AST 15 - 41 U/L 17   ALT 0 - 44 U/L 15       Latest Ref Rng & Units 05/18/2019   11:08 AM  CBC  WBC 4.0 - 10.5 K/uL 8.7   Hemoglobin 12.0 - 15.0 g/dL 87.5   Hematocrit 64.3 - 46.0 % 40.1   Platelets 150 - 400 K/uL 255     Assessment and plan- Patient is a 63 y.o. female with history of stage I left breast cancer currently on letrozole and this is a routine follow-up visit  Patient will complete 5 years of endocrine therapy in March 2025 and following that she can stop it altogether.  I have however recommended thatShe should continue with calcium and vitamin D due to her pre-existing osteopenia.  I will see her back in 1 year no labs.  She will be due for mammogram in October 2025 which I will schedule.  Clinically she is doing well with no concerning signs and symptoms of recurrence based on today's exam   Visit Diagnosis 1. Encounter for follow-up surveillance of breast cancer   2. Use of letrozole (Femara)   3. High risk medication use      Dr. Owens Shark, MD, MPH [facility] [phone] 12/09/2023 12:49 PM

## 2023-12-10 DIAGNOSIS — E1165 Type 2 diabetes mellitus with hyperglycemia: Secondary | ICD-10-CM | POA: Diagnosis not present

## 2023-12-10 DIAGNOSIS — M0579 Rheumatoid arthritis with rheumatoid factor of multiple sites without organ or systems involvement: Secondary | ICD-10-CM | POA: Diagnosis not present

## 2023-12-10 DIAGNOSIS — Z0000 Encounter for general adult medical examination without abnormal findings: Secondary | ICD-10-CM | POA: Diagnosis not present

## 2023-12-10 DIAGNOSIS — E119 Type 2 diabetes mellitus without complications: Secondary | ICD-10-CM | POA: Diagnosis not present

## 2023-12-10 DIAGNOSIS — C50912 Malignant neoplasm of unspecified site of left female breast: Secondary | ICD-10-CM | POA: Diagnosis not present

## 2023-12-10 DIAGNOSIS — E782 Mixed hyperlipidemia: Secondary | ICD-10-CM | POA: Diagnosis not present

## 2023-12-10 DIAGNOSIS — E669 Obesity, unspecified: Secondary | ICD-10-CM | POA: Diagnosis not present

## 2023-12-10 DIAGNOSIS — Z23 Encounter for immunization: Secondary | ICD-10-CM | POA: Diagnosis not present

## 2023-12-10 DIAGNOSIS — I10 Essential (primary) hypertension: Secondary | ICD-10-CM | POA: Diagnosis not present

## 2023-12-11 DIAGNOSIS — Z7960 Long term (current) use of unspecified immunomodulators and immunosuppressants: Secondary | ICD-10-CM | POA: Diagnosis not present

## 2023-12-11 DIAGNOSIS — M0579 Rheumatoid arthritis with rheumatoid factor of multiple sites without organ or systems involvement: Secondary | ICD-10-CM | POA: Diagnosis not present

## 2023-12-11 DIAGNOSIS — R202 Paresthesia of skin: Secondary | ICD-10-CM | POA: Diagnosis not present

## 2024-03-08 DIAGNOSIS — I10 Essential (primary) hypertension: Secondary | ICD-10-CM | POA: Diagnosis not present

## 2024-03-08 DIAGNOSIS — E119 Type 2 diabetes mellitus without complications: Secondary | ICD-10-CM | POA: Diagnosis not present

## 2024-03-08 DIAGNOSIS — E669 Obesity, unspecified: Secondary | ICD-10-CM | POA: Diagnosis not present

## 2024-03-08 DIAGNOSIS — C50912 Malignant neoplasm of unspecified site of left female breast: Secondary | ICD-10-CM | POA: Diagnosis not present

## 2024-03-08 DIAGNOSIS — M0579 Rheumatoid arthritis with rheumatoid factor of multiple sites without organ or systems involvement: Secondary | ICD-10-CM | POA: Diagnosis not present

## 2024-03-08 DIAGNOSIS — E782 Mixed hyperlipidemia: Secondary | ICD-10-CM | POA: Diagnosis not present

## 2024-03-09 DIAGNOSIS — Z7960 Long term (current) use of unspecified immunomodulators and immunosuppressants: Secondary | ICD-10-CM | POA: Diagnosis not present

## 2024-03-09 DIAGNOSIS — M0579 Rheumatoid arthritis with rheumatoid factor of multiple sites without organ or systems involvement: Secondary | ICD-10-CM | POA: Diagnosis not present

## 2024-06-07 DIAGNOSIS — E669 Obesity, unspecified: Secondary | ICD-10-CM | POA: Diagnosis not present

## 2024-06-07 DIAGNOSIS — E782 Mixed hyperlipidemia: Secondary | ICD-10-CM | POA: Diagnosis not present

## 2024-06-07 DIAGNOSIS — E119 Type 2 diabetes mellitus without complications: Secondary | ICD-10-CM | POA: Diagnosis not present

## 2024-06-07 DIAGNOSIS — I10 Essential (primary) hypertension: Secondary | ICD-10-CM | POA: Diagnosis not present

## 2024-06-07 DIAGNOSIS — C50912 Malignant neoplasm of unspecified site of left female breast: Secondary | ICD-10-CM | POA: Diagnosis not present

## 2024-06-07 DIAGNOSIS — M0579 Rheumatoid arthritis with rheumatoid factor of multiple sites without organ or systems involvement: Secondary | ICD-10-CM | POA: Diagnosis not present

## 2024-06-09 DIAGNOSIS — Z7960 Long term (current) use of unspecified immunomodulators and immunosuppressants: Secondary | ICD-10-CM | POA: Diagnosis not present

## 2024-06-09 DIAGNOSIS — M0579 Rheumatoid arthritis with rheumatoid factor of multiple sites without organ or systems involvement: Secondary | ICD-10-CM | POA: Diagnosis not present

## 2024-09-22 ENCOUNTER — Ambulatory Visit
Admission: RE | Admit: 2024-09-22 | Discharge: 2024-09-22 | Disposition: A | Discharge status: Home / Self Care | Source: Ambulatory Visit | Attending: Oncology | Admitting: Oncology

## 2024-09-22 DIAGNOSIS — Z1231 Encounter for screening mammogram for malignant neoplasm of breast: Secondary | ICD-10-CM | POA: Insufficient documentation

## 2024-09-22 DIAGNOSIS — Z853 Personal history of malignant neoplasm of breast: Secondary | ICD-10-CM | POA: Insufficient documentation

## 2024-09-22 DIAGNOSIS — Z08 Encounter for follow-up examination after completed treatment for malignant neoplasm: Secondary | ICD-10-CM | POA: Diagnosis present

## 2024-12-07 ENCOUNTER — Inpatient Hospital Stay: Payer: 59 | Admitting: Oncology
